# Patient Record
Sex: Female | Born: 1955 | Race: White | Hispanic: No | Marital: Married | Smoking: Former smoker
Health system: Southern US, Community
[De-identification: ages and names within clinical notes are randomized; demographics above are authoritative.]

## PROBLEM LIST (undated history)

## (undated) DIAGNOSIS — F4310 Post-traumatic stress disorder, unspecified: Secondary | ICD-10-CM

## (undated) DIAGNOSIS — F32A Depression, unspecified: Secondary | ICD-10-CM

## (undated) DIAGNOSIS — F329 Major depressive disorder, single episode, unspecified: Secondary | ICD-10-CM

## (undated) HISTORY — PX: TUBAL LIGATION: SHX77

## (undated) HISTORY — PX: TYMPANOSTOMY TUBE PLACEMENT: SHX32

---

## 2009-06-22 ENCOUNTER — Ambulatory Visit (HOSPITAL_COMMUNITY): Admission: RE | Admit: 2009-06-22 | Discharge: 2009-06-22 | Payer: Self-pay | Admitting: Family Medicine

## 2009-07-06 ENCOUNTER — Ambulatory Visit (HOSPITAL_COMMUNITY): Admission: RE | Admit: 2009-07-06 | Discharge: 2009-07-06 | Payer: Self-pay | Admitting: Family Medicine

## 2010-07-24 ENCOUNTER — Ambulatory Visit (HOSPITAL_COMMUNITY): Admission: RE | Admit: 2010-07-24 | Discharge: 2010-07-24 | Payer: Self-pay | Admitting: Family Medicine

## 2012-03-10 ENCOUNTER — Other Ambulatory Visit (HOSPITAL_COMMUNITY): Payer: Self-pay | Admitting: Family Medicine

## 2012-03-10 DIAGNOSIS — Z139 Encounter for screening, unspecified: Secondary | ICD-10-CM

## 2012-03-13 ENCOUNTER — Ambulatory Visit (HOSPITAL_COMMUNITY)
Admission: RE | Admit: 2012-03-13 | Discharge: 2012-03-13 | Disposition: A | Discharge status: Home / Self Care | Payer: Self-pay | Source: Ambulatory Visit | Attending: Family Medicine | Admitting: Family Medicine

## 2012-03-13 DIAGNOSIS — Z1231 Encounter for screening mammogram for malignant neoplasm of breast: Secondary | ICD-10-CM | POA: Insufficient documentation

## 2012-03-13 DIAGNOSIS — Z139 Encounter for screening, unspecified: Secondary | ICD-10-CM

## 2013-03-25 ENCOUNTER — Emergency Department (HOSPITAL_COMMUNITY)
Admission: EM | Admit: 2013-03-25 | Discharge: 2013-03-25 | Disposition: A | Discharge status: Home / Self Care | Payer: Self-pay | Attending: Emergency Medicine | Admitting: Emergency Medicine

## 2013-03-25 ENCOUNTER — Emergency Department (HOSPITAL_COMMUNITY): Payer: Self-pay

## 2013-03-25 ENCOUNTER — Encounter (HOSPITAL_COMMUNITY): Payer: Self-pay

## 2013-03-25 DIAGNOSIS — F329 Major depressive disorder, single episode, unspecified: Secondary | ICD-10-CM | POA: Insufficient documentation

## 2013-03-25 DIAGNOSIS — X58XXXA Exposure to other specified factors, initial encounter: Secondary | ICD-10-CM | POA: Insufficient documentation

## 2013-03-25 DIAGNOSIS — S0993XA Unspecified injury of face, initial encounter: Secondary | ICD-10-CM | POA: Insufficient documentation

## 2013-03-25 DIAGNOSIS — F17200 Nicotine dependence, unspecified, uncomplicated: Secondary | ICD-10-CM | POA: Insufficient documentation

## 2013-03-25 DIAGNOSIS — Y9389 Activity, other specified: Secondary | ICD-10-CM | POA: Insufficient documentation

## 2013-03-25 DIAGNOSIS — Y929 Unspecified place or not applicable: Secondary | ICD-10-CM | POA: Insufficient documentation

## 2013-03-25 DIAGNOSIS — M549 Dorsalgia, unspecified: Secondary | ICD-10-CM | POA: Insufficient documentation

## 2013-03-25 DIAGNOSIS — R569 Unspecified convulsions: Secondary | ICD-10-CM | POA: Insufficient documentation

## 2013-03-25 DIAGNOSIS — F3289 Other specified depressive episodes: Secondary | ICD-10-CM | POA: Insufficient documentation

## 2013-03-25 DIAGNOSIS — Z79899 Other long term (current) drug therapy: Secondary | ICD-10-CM | POA: Insufficient documentation

## 2013-03-25 HISTORY — DX: Depression, unspecified: F32.A

## 2013-03-25 HISTORY — DX: Major depressive disorder, single episode, unspecified: F32.9

## 2013-03-25 LAB — CBC WITH DIFFERENTIAL/PLATELET
Basophils Absolute: 0 10*3/uL (ref 0.0–0.1)
Eosinophils Absolute: 0 10*3/uL (ref 0.0–0.7)
Lymphocytes Relative: 12 % (ref 12–46)
MCV: 87.8 fL (ref 78.0–100.0)
Monocytes Absolute: 0.2 10*3/uL (ref 0.1–1.0)
Monocytes Relative: 3 % (ref 3–12)

## 2013-03-25 LAB — URINALYSIS, ROUTINE W REFLEX MICROSCOPIC
Bilirubin Urine: NEGATIVE
Glucose, UA: NEGATIVE mg/dL
Ketones, ur: NEGATIVE mg/dL
Leukocytes, UA: NEGATIVE
Protein, ur: NEGATIVE mg/dL
Urobilinogen, UA: 1 mg/dL (ref 0.0–1.0)
pH: >9 — ABNORMAL HIGH (ref 5.0–8.0)

## 2013-03-25 LAB — RAPID URINE DRUG SCREEN, HOSP PERFORMED
Amphetamines: NOT DETECTED
Barbiturates: NOT DETECTED
Benzodiazepines: NOT DETECTED
Cocaine: NOT DETECTED
Tetrahydrocannabinol: NOT DETECTED

## 2013-03-25 LAB — ETHANOL: Alcohol, Ethyl (B): <11 mg/dL (ref 0–11)

## 2013-03-25 LAB — BASIC METABOLIC PANEL
CO2: 30 mEq/L (ref 19–32)
Calcium: 9.1 mg/dL (ref 8.4–10.5)
Chloride: 99 mEq/L (ref 96–112)
Creatinine, Ser: 0.74 mg/dL (ref 0.50–1.10)
Sodium: 138 mEq/L (ref 135–145)

## 2013-03-25 MED ORDER — DIAZEPAM 10 MG PO TABS: 10.0000 mg | ORAL_TABLET | Freq: Two times a day (BID) | ORAL | Status: DC | PRN

## 2013-03-25 MED ORDER — HYDROMORPHONE HCL PF 1 MG/ML IJ SOLN
0.5000 mg | Freq: Once | INTRAMUSCULAR | Status: AC
  Administered 2013-03-25: 0.5 mg via INTRAVENOUS
  Filled 2013-03-25: qty 1

## 2013-03-25 MED ORDER — DIAZEPAM 5 MG PO TABS
5.0000 mg | ORAL_TABLET | Freq: Once | ORAL | Status: AC
  Administered 2013-03-25: 5 mg via ORAL
  Filled 2013-03-25: qty 1
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
## 2013-03-25 NOTE — ED Notes (Addendum)
Husband reports approx 0230 this morning he heard pt yell out.  When he walked into the bedroom he saw pt halfway off the bed convulsing and had irregular breathing.  Husband said the episode lasted approx .  Pt c/o pain to r rib area.  Pt says she did not fall but pt does not remember the episode.   Denies history of seizures.  Reports ran out of valium last week.

## 2014-06-14 ENCOUNTER — Other Ambulatory Visit (HOSPITAL_COMMUNITY): Payer: Self-pay | Admitting: Physician Assistant

## 2014-06-14 DIAGNOSIS — Z1231 Encounter for screening mammogram for malignant neoplasm of breast: Secondary | ICD-10-CM

## 2014-06-17 ENCOUNTER — Ambulatory Visit (HOSPITAL_COMMUNITY)
Admission: RE | Admit: 2014-06-17 | Discharge: 2014-06-17 | Disposition: A | Discharge status: Home / Self Care | Payer: Self-pay | Source: Ambulatory Visit | Attending: Physician Assistant | Admitting: Physician Assistant

## 2014-06-17 DIAGNOSIS — Z1231 Encounter for screening mammogram for malignant neoplasm of breast: Secondary | ICD-10-CM

## 2014-06-21 ENCOUNTER — Other Ambulatory Visit: Payer: Self-pay | Admitting: Physician Assistant

## 2014-06-21 DIAGNOSIS — R928 Other abnormal and inconclusive findings on diagnostic imaging of breast: Secondary | ICD-10-CM

## 2014-07-09 ENCOUNTER — Other Ambulatory Visit (HOSPITAL_COMMUNITY): Payer: Self-pay | Admitting: *Deleted

## 2014-07-09 DIAGNOSIS — R928 Other abnormal and inconclusive findings on diagnostic imaging of breast: Secondary | ICD-10-CM

## 2014-07-13 ENCOUNTER — Ambulatory Visit (HOSPITAL_COMMUNITY)
Admission: RE | Admit: 2014-07-13 | Discharge: 2014-07-13 | Disposition: A | Discharge status: Home / Self Care | Payer: PRIVATE HEALTH INSURANCE | Source: Ambulatory Visit | Attending: *Deleted | Admitting: *Deleted

## 2014-07-13 ENCOUNTER — Ambulatory Visit (HOSPITAL_COMMUNITY)
Admission: RE | Admit: 2014-07-13 | Discharge: 2014-07-13 | Disposition: A | Discharge status: Home / Self Care | Payer: PRIVATE HEALTH INSURANCE | Source: Ambulatory Visit | Attending: Diagnostic Radiology | Admitting: Diagnostic Radiology

## 2014-07-13 ENCOUNTER — Other Ambulatory Visit (HOSPITAL_COMMUNITY): Payer: Self-pay | Admitting: *Deleted

## 2014-07-13 DIAGNOSIS — R928 Other abnormal and inconclusive findings on diagnostic imaging of breast: Secondary | ICD-10-CM

## 2014-07-13 DIAGNOSIS — N6009 Solitary cyst of unspecified breast: Secondary | ICD-10-CM | POA: Insufficient documentation

## 2015-06-01 ENCOUNTER — Observation Stay (HOSPITAL_COMMUNITY)
Admission: EM | Admit: 2015-06-01 | Discharge: 2015-06-02 | Disposition: A | Discharge status: Home / Self Care | Payer: Self-pay | Attending: Internal Medicine | Admitting: Internal Medicine

## 2015-06-01 ENCOUNTER — Emergency Department (HOSPITAL_COMMUNITY): Payer: Self-pay

## 2015-06-01 ENCOUNTER — Encounter (HOSPITAL_COMMUNITY): Payer: Self-pay | Admitting: Emergency Medicine

## 2015-06-01 DIAGNOSIS — F419 Anxiety disorder, unspecified: Secondary | ICD-10-CM

## 2015-06-01 DIAGNOSIS — F4310 Post-traumatic stress disorder, unspecified: Secondary | ICD-10-CM

## 2015-06-01 DIAGNOSIS — Z720 Tobacco use: Secondary | ICD-10-CM

## 2015-06-01 DIAGNOSIS — R05 Cough: Secondary | ICD-10-CM | POA: Insufficient documentation

## 2015-06-01 DIAGNOSIS — F329 Major depressive disorder, single episode, unspecified: Secondary | ICD-10-CM | POA: Insufficient documentation

## 2015-06-01 DIAGNOSIS — F32A Depression, unspecified: Secondary | ICD-10-CM

## 2015-06-01 DIAGNOSIS — R0902 Hypoxemia: Principal | ICD-10-CM | POA: Diagnosis present

## 2015-06-01 DIAGNOSIS — J441 Chronic obstructive pulmonary disease with (acute) exacerbation: Secondary | ICD-10-CM

## 2015-06-01 DIAGNOSIS — Z79899 Other long term (current) drug therapy: Secondary | ICD-10-CM | POA: Insufficient documentation

## 2015-06-01 DIAGNOSIS — J9621 Acute and chronic respiratory failure with hypoxia: Secondary | ICD-10-CM | POA: Insufficient documentation

## 2015-06-01 HISTORY — DX: Post-traumatic stress disorder, unspecified: F43.10

## 2015-06-01 LAB — BASIC METABOLIC PANEL
Anion gap: 9 (ref 5–15)
BUN: 10 mg/dL (ref 6–20)
CALCIUM: 9.1 mg/dL (ref 8.9–10.3)
CO2: 27 mmol/L (ref 22–32)
Chloride: 104 mmol/L (ref 101–111)
Creatinine, Ser: 0.75 mg/dL (ref 0.44–1.00)
GFR calc Af Amer: >60 mL/min (ref >60)
GFR calc non Af Amer: >60 mL/min (ref >60)
Glucose, Bld: 102 mg/dL — ABNORMAL HIGH (ref 65–99)
POTASSIUM: 3.3 mmol/L — AB (ref 3.5–5.1)
Sodium: 140 mmol/L (ref 135–145)

## 2015-06-01 LAB — CBC WITH DIFFERENTIAL/PLATELET
Basophils Absolute: 0 10*3/uL (ref 0.0–0.1)
Basophils Relative: 1 % (ref 0–1)
EOS ABS: 0.1 10*3/uL (ref 0.0–0.7)
EOS PCT: 1 % (ref 0–5)
HEMATOCRIT: 41.8 % (ref 36.0–46.0)
Hemoglobin: 14.4 g/dL (ref 12.0–15.0)
LYMPHS ABS: 2.3 10*3/uL (ref 0.7–4.0)
LYMPHS PCT: 44 % (ref 12–46)
MCH: 31.4 pg (ref 26.0–34.0)
MCHC: 34.4 g/dL (ref 30.0–36.0)
MCV: 91.3 fL (ref 78.0–100.0)
MONOS PCT: 7 % (ref 3–12)
Monocytes Absolute: 0.3 10*3/uL (ref 0.1–1.0)
Neutro Abs: 2.5 10*3/uL (ref 1.7–7.7)
Neutrophils Relative %: 47 % (ref 43–77)
PLATELETS: 160 10*3/uL (ref 150–400)
RBC: 4.58 MIL/uL (ref 3.87–5.11)
RDW: 12.5 % (ref 11.5–15.5)
WBC: 5.3 10*3/uL (ref 4.0–10.5)

## 2015-06-01 LAB — D-DIMER, QUANTITATIVE: D-Dimer, Quant: 0.28 ug/mL-FEU (ref 0.00–0.48)

## 2015-06-01 MED ORDER — METHYLPREDNISOLONE SODIUM SUCC 125 MG IJ SOLR
125.0000 mg | Freq: Once | INTRAMUSCULAR | Status: AC
  Administered 2015-06-01: 125 mg via INTRAVENOUS
  Filled 2015-06-01: qty 2

## 2015-06-01 MED ORDER — METHYLPREDNISOLONE SODIUM SUCC 125 MG IJ SOLR
60.0000 mg | Freq: Four times a day (QID) | INTRAMUSCULAR | Status: DC
Start: 2015-06-01 — End: 2015-06-02
  Administered 2015-06-01 – 2015-06-02 (×3): 60 mg via INTRAVENOUS
  Filled 2015-06-01 (×3): qty 2

## 2015-06-01 MED ORDER — IPRATROPIUM-ALBUTEROL 0.5-2.5 (3) MG/3ML IN SOLN
3.0000 mL | Freq: Four times a day (QID) | RESPIRATORY_TRACT | Status: DC
  Administered 2015-06-02 (×2): 3 mL via RESPIRATORY_TRACT
  Filled 2015-06-01 (×2): qty 3

## 2015-06-01 MED ORDER — IPRATROPIUM BROMIDE 0.06 % NA SOLN
2.0000 | Freq: Four times a day (QID) | NASAL | Status: DC | PRN
Start: 2015-06-01 — End: 2015-06-02
  Filled 2015-06-01: qty 15

## 2015-06-01 MED ORDER — CITALOPRAM HYDROBROMIDE 20 MG PO TABS
40.0000 mg | ORAL_TABLET | Freq: Every evening | ORAL | Status: DC
  Administered 2015-06-02: 40 mg via ORAL
  Filled 2015-06-01 (×2): qty 1

## 2015-06-01 MED ORDER — ALPRAZOLAM 0.5 MG PO TABS
1.0000 mg | ORAL_TABLET | Freq: Two times a day (BID) | ORAL | Status: DC
  Administered 2015-06-01: 2 mg via ORAL
  Administered 2015-06-02: 1 mg via ORAL
  Filled 2015-06-01 (×2): qty 4

## 2015-06-01 MED ORDER — GUAIFENESIN ER 600 MG PO TB12
1200.0000 mg | ORAL_TABLET | Freq: Two times a day (BID) | ORAL | Status: DC
  Administered 2015-06-01 – 2015-06-02 (×2): 1200 mg via ORAL
  Filled 2015-06-01 (×2): qty 2

## 2015-06-01 MED ORDER — IPRATROPIUM BROMIDE 0.02 % IN SOLN: 0.5000 mg | Freq: Once | RESPIRATORY_TRACT | Status: DC

## 2015-06-01 MED ORDER — ALBUTEROL SULFATE (2.5 MG/3ML) 0.083% IN NEBU
5.0000 mg | INHALATION_SOLUTION | Freq: Once | RESPIRATORY_TRACT | Status: AC
  Administered 2015-06-01: 5 mg via RESPIRATORY_TRACT
  Filled 2015-06-01: qty 6

## 2015-06-01 MED ORDER — IPRATROPIUM-ALBUTEROL 0.5-2.5 (3) MG/3ML IN SOLN
3.0000 mL | RESPIRATORY_TRACT | Status: DC
  Administered 2015-06-01: 3 mL via RESPIRATORY_TRACT
  Filled 2015-06-01: qty 3

## 2015-06-01 MED ORDER — ALBUTEROL SULFATE (2.5 MG/3ML) 0.083% IN NEBU
2.5000 mg | INHALATION_SOLUTION | Freq: Once | RESPIRATORY_TRACT | Status: AC
  Administered 2015-06-01: 2.5 mg via RESPIRATORY_TRACT
  Filled 2015-06-01: qty 3

## 2015-06-01 MED ORDER — HEPARIN SODIUM (PORCINE) 5000 UNIT/ML IJ SOLN
5000.0000 [IU] | Freq: Three times a day (TID) | INTRAMUSCULAR | Status: DC
  Filled 2015-06-01: qty 1

## 2015-06-01 MED ORDER — IPRATROPIUM-ALBUTEROL 0.5-2.5 (3) MG/3ML IN SOLN
3.0000 mL | Freq: Once | RESPIRATORY_TRACT | Status: AC
  Administered 2015-06-01: 3 mL via RESPIRATORY_TRACT
  Filled 2015-06-01: qty 3

## 2015-06-01 MED ORDER — CITALOPRAM HYDROBROMIDE 20 MG PO TABS
ORAL_TABLET | ORAL | Status: AC
Start: 2015-06-01 — End: 2015-06-01
  Filled 2015-06-01: qty 2

## 2015-06-01 MED ORDER — ALBUTEROL SULFATE (2.5 MG/3ML) 0.083% IN NEBU
2.5000 mg | INHALATION_SOLUTION | RESPIRATORY_TRACT | Status: AC | PRN
  Administered 2015-06-01: 2.5 mg via RESPIRATORY_TRACT
  Filled 2015-06-01: qty 3

## 2015-06-01 MED ORDER — ONDANSETRON HCL 4 MG PO TABS: 4.0000 mg | ORAL_TABLET | Freq: Four times a day (QID) | ORAL | Status: DC | PRN

## 2015-06-01 MED ORDER — NICOTINE 7 MG/24HR TD PT24
7.0000 mg | MEDICATED_PATCH | Freq: Every day | TRANSDERMAL | Status: DC
  Administered 2015-06-01 – 2015-06-02 (×2): 7 mg via TRANSDERMAL
  Filled 2015-06-01 (×3): qty 1

## 2015-06-01 MED ORDER — ALBUTEROL SULFATE (2.5 MG/3ML) 0.083% IN NEBU: 5.0000 mg | INHALATION_SOLUTION | Freq: Once | RESPIRATORY_TRACT | Status: DC

## 2015-06-01 MED ORDER — NICOTINE 7 MG/24HR TD PT24
MEDICATED_PATCH | TRANSDERMAL | Status: AC
  Filled 2015-06-01: qty 1

## 2015-06-01 MED ORDER — ACETAMINOPHEN 325 MG PO TABS: 650.0000 mg | ORAL_TABLET | Freq: Four times a day (QID) | ORAL | Status: DC | PRN

## 2015-06-01 MED ORDER — DOXYCYCLINE HYCLATE 100 MG PO TABS
100.0000 mg | ORAL_TABLET | Freq: Two times a day (BID) | ORAL | Status: DC
  Administered 2015-06-01 – 2015-06-02 (×2): 100 mg via ORAL
  Filled 2015-06-01 (×2): qty 1

## 2015-06-01 MED ORDER — ACETAMINOPHEN 650 MG RE SUPP: 650.0000 mg | Freq: Four times a day (QID) | RECTAL | Status: DC | PRN

## 2015-06-01 MED ORDER — ONDANSETRON HCL 4 MG/2ML IJ SOLN: 4.0000 mg | Freq: Four times a day (QID) | INTRAMUSCULAR | Status: DC | PRN

## 2015-06-01 MED ORDER — OXYCODONE HCL 5 MG PO TABS
5.0000 mg | ORAL_TABLET | ORAL | Status: DC | PRN
  Filled 2015-06-01: qty 1

## 2015-06-01 NOTE — ED Notes (Signed)
Pt reports increasing sob since Thursday with no relief from 2 nebulizer tx's today. Pt is not on home O2.

## 2015-06-01 NOTE — H&P (Signed)
[facility] History and Physical  Diane KiefVicki W Franey WUJ:[id number] DOB: Dec 07, 1955 DOA: 06/01/2015  Referring physician: Dr Rubin PayorPickering - [facility] PCP: Pershing ProudJACKSON,SAMANTHA, PA-C   Chief Complaint: SOB  HPI: Diane Simmons is a 59 y.o. female  7 days of SOB. She intermittent but now constant, getting worse, associated with wheezing, minimal productive cough, "rattle in chest, ". Worse with exertion, improves with rest and staying cool, patient states that she had to panic attacks due to the stress of not feeling like she can breathe. Patient continues to smoke but stopped since onset of symptoms as this makes it worse. Patient has tried Mucinex with little to no benefit, patient went to a free walking clinic today and was told that her oxygen levels were 83% and told to come to the ED. Significant URI symptoms especially postnasal drip which started a couple days before shortness of breath.   Review of Systems:  Constitutional:  No weight loss, night sweats, Fevers, chills, HEENT:  No headaches, Difficulty swallowing,Tooth/dental problems,Sore throat, ,  Cardio-vascular:  No chest pain, Orthopnea, PND, swelling in lower extremities, anasarca, dizziness, palpitations  GI:  No heartburn, indigestion, abdominal pain, nausea, vomiting, diarrhea, change in bowel habits, loss of appetite  Resp: Per HPI Skin:  no rash or lesions.  GU:  no dysuria, change in color of urine, no urgency or frequency. No flank pain.  Musculoskeletal:   No joint pain or swelling. No decreased range of motion. No back pain.  Psych:  No change in mood or affect. No depression or anxiety. No memory loss.   Past Medical History  Diagnosis Date  . Depression   . PTSD (post-traumatic stress disorder)    Past Surgical History  Procedure Laterality Date  . Tubal ligation    . Cesarean section     Social History:  reports that she has been smoking Cigarettes.  She has been smoking about 0.25 packs per day. She does not  have any smokeless tobacco history on file. She reports that she does not drink alcohol or use illicit drugs.  Allergies  Allergen Reactions  . Aleve [Naproxen Sodium] Anaphylaxis  . Erythromycin Other (See Comments)    Makes patient feel like she is having morning sickness    Family History  Problem Relation Age of Onset  . Prostate cancer Father   . Diabetes Maternal Grandfather      Prior to Admission medications   Medication Sig Start Date End Date Taking? Authorizing Provider  alprazolam Prudy Feeler(XANAX) 2 MG tablet Take 1 mg by mouth 2 (two) times daily as needed for anxiety.   Yes Historical Provider, MD  citalopram (CELEXA) 40 MG tablet Take 40 mg by mouth every evening.   Yes Historical Provider, MD  diazepam (VALIUM) 10 MG tablet Take 1 tablet (10 mg total) by mouth 2 (two) times daily as needed for anxiety. Patient not taking: Reported on 06/01/2015 03/25/13   Donnetta HutchingBrian Cook, MD   Physical Exam: Filed Vitals:   06/01/15 1348 06/01/15 1617 06/01/15 1627 06/01/15 1739  BP:   106/65 109/67  Pulse:   79 87  Temp:      TempSrc:      Resp:   18 22  SpO2: 94% 95% 93% 94%    Wt Readings from Last 3 Encounters:  No data found for Wt    General: Mild distress Eyes:  PERRL, normal lids, irise, conjunctival injection ENT: Mucous membranes Neck:  no LAD, masses or thyromegaly Cardiovascular:  RRR, no m/r/g. No  LE edema. Respiratory: Diffuse wheezing in all lung fields, few crackles in bases, increased respiratory effort, Abdomen:  soft, ntnd Skin:  no rash or induration seen on limited exam Musculoskeletal:  grossly normal tone BUE/BLE Psychiatric:  grossly normal mood and affect, speech fluent and appropriate Neurologic:  grossly non-focal.          Labs on Admission:  Basic Metabolic Panel:  Recent Labs Lab 06/01/15 1410  NA 140  K 3.3*  CL 104  CO2 27  GLUCOSE 102*  BUN 10  CREATININE 0.75  CALCIUM 9.1   Liver Function Tests: No results for input(s): AST, ALT,  ALKPHOS, BILITOT, PROT, ALBUMIN in the last 168 hours. No results for input(s): LIPASE, AMYLASE in the last 168 hours. No results for input(s): AMMONIA in the last 168 hours. CBC:  Recent Labs Lab 06/01/15 1410  WBC 5.3  NEUTROABS 2.5  HGB 14.4  HCT 41.8  MCV 91.3  PLT 160   Cardiac Enzymes: No results for input(s): CKTOTAL, CKMB, CKMBINDEX, TROPONINI in the last 168 hours.  BNP (last 3 results) No results for input(s): BNP in the last 8760 hours.  ProBNP (last 3 results) No results for input(s): PROBNP in the last 8760 hours.  CBG: No results for input(s): GLUCAP in the last 168 hours.  Radiological Exams on Admission: Dg Chest 2 View  06/01/2015   CLINICAL DATA:  Nonproductive cough and chest tightness for 6 days. Difficulty breathing.  EXAM: CHEST  2 VIEW  COMPARISON:  March 25, 2013 and June 22, 2009  FINDINGS: There is mild scarring in the left base. No edema or consolidation is apparent. Heart size and pulmonary vascularity are normal. No apparent adenopathy. There is mid thoracic dextroscoliosis. There is marked collapse of the T5 vertebral body.  IMPRESSION: No edema or consolidation. Mild scarring left base. Marked collapse of the T5 vertebral body, present on 2014 study.   Electronically Signed   By: Bretta Bang III M.D.   On: 06/01/2015 13:30    Assessment/Plan Principal Problem:   COPD exacerbation Active Problems:   Hypoxia   Depression   PTSD (post-traumatic stress disorder)   Tobacco abuse   Anxiety   COPD exacerbation: No formal diagnoses of COPD the patient with new O2 requirement, diffuse wheezing, productive cough, and chronic smoker. CXR without evidence of pneumonia - MedSurg -  Solu-Medrol 60 mg every 6 - Duo nebs every 4 hours - Doxycycline - Formal PFTs after discharge - Nasal Atrovent, Mucinex - O2 PRN  Depression/PTSD: - Continue Celexa  Anxiety: -  continue Xanax   Tobacco: Smokes quarter pack per day - Nicotine patch -  Tobacco cessation counseling  Code Status: FULL DVT Prophylaxis: Hep Family Communication: Husband Disposition Plan: Pendingi mprovement     Sony Schlarb J, MD Family [facility] [email or web address] Password [email or web address]

## 2015-06-01 NOTE — ED Notes (Signed)
Pt placed in wheelchair on O2 at 2l, O2 saturation increased to 94%.

## 2015-06-01 NOTE — ED Notes (Signed)
MD at bedside. 

## 2015-06-01 NOTE — ED Provider Notes (Signed)
CSN: [id number]     Arrival date & time 06/01/15  1218 History   First MD Initiated Contact with Patient 06/01/15 1254     Chief Complaint  Patient presents with  . Shortness of Breath     (Consider location/radiation/quality/duration/timing/severity/associated sxs/prior Treatment) Patient is a 58 y.o. female presenting with shortness of breath. The history is provided by the patient.  Shortness of Breath Severity:  Moderate Associated symptoms: cough   Associated symptoms: no abdominal pain, no chest pain, no fever, no headaches and no vomiting    patient has had shortness of breath and cough the last several days. States she feels as if there is some mixed, but nothing has come up. States it feels aggressive in her throat. She is apparently a smoker but she downplays this on the history. 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 States she has not smoked much once was started. Denies fever. She states her husband also had similar symptoms. No swelling or legs. Was seen at the free clinic and found to have saturations in the 80s. They stay down there after breathing treatments. Denies swelling or legs. She is not on oxygen at home and her saturations up, up to the 90s on nasal cannula  Past Medical History  Diagnosis Date  . Depression   . PTSD (post-traumatic stress disorder)    Past Surgical History  Procedure Laterality Date  . Tubal ligation    . Cesarean section     Family History  Problem Relation Age of Onset  . Prostate cancer Father   . Diabetes Maternal Grandfather    History  Substance Use Topics  . Smoking status: Current Every Day Smoker -- 0.25 packs/day    Types: Cigarettes  . Smokeless tobacco: Not on file  . Alcohol Use: No   OB History    No data available     Review of Systems  Constitutional: Positive for fatigue. Negative for fever, activity change and appetite change.  Eyes: Negative for pain.  Respiratory: Positive for cough and  shortness of breath. Negative for chest tightness.   Cardiovascular: Negative for chest pain and leg swelling.  Gastrointestinal: Negative for nausea, vomiting, abdominal pain and diarrhea.  Genitourinary: Negative for flank pain.  Musculoskeletal: Negative for back pain.  Neurological: Negative for weakness, numbness and headaches.  Psychiatric/Behavioral: Negative for behavioral problems.      Allergies  Aleve and Erythromycin  Home Medications   Prior to Admission medications   Medication Sig Start Date End Date Taking? Authorizing Provider  alprazolam Prudy Feeler) 2 MG tablet Take 1-2 mg by mouth 2 (two) times daily. 1 mg in the morning and 2 mg at bedtime.   Yes Historical Provider, MD  citalopram (CELEXA) 40 MG tablet Take 40 mg by mouth every evening.   Yes Historical Provider, MD  diazepam (VALIUM) 10 MG tablet Take 1 tablet (10 mg total) by mouth 2 (two) times daily as needed for anxiety. Patient not taking: Reported on 06/01/2015 03/25/13   Donnetta Hutching, MD   BP 112/70 mmHg  Pulse 85  Temp(Src) 97.8 F (36.6 C) (Oral)  Resp 18  Ht  (1.626 m)  Wt 130 lb (58.968 kg)  BMI 22.30 kg/m2  SpO2 95% Physical Exam  Constitutional: She appears well-developed and well-nourished.  HENT:  Head: Atraumatic.  Eyes: Pupils are equal, round, and reactive to light.  Neck: Neck supple.  Cardiovascular: Normal rate.   Pulmonary/Chest:  Diffuse harsh wheezes and prolonged  expirations  Abdominal: Soft.  Neurological: She is alert.    ED Course  Procedures (including critical care time) Labs Review Labs Reviewed  BASIC METABOLIC PANEL - Abnormal; Notable for the following:    Potassium 3.3 (*)    Glucose, Bld 102 (*)    All other components within normal limits  BASIC METABOLIC PANEL - Abnormal; Notable for the following:    Glucose, Bld 148 (*)    All other components within normal limits  CBC WITH DIFFERENTIAL/PLATELET  D-DIMER, QUANTITATIVE (NOT AT [facility])  CBC  HEMOGLOBIN  A1C    Imaging Review Dg Chest 2 View  06/01/2015   CLINICAL DATA:  Nonproductive cough and chest tightness for 6 days. Difficulty breathing.  EXAM: CHEST  2 VIEW  COMPARISON:  March 25, 2013 and June 22, 2009  FINDINGS: There is mild scarring in the left base. No edema or consolidation is apparent. Heart size and pulmonary vascularity are normal. No apparent adenopathy. There is mid thoracic dextroscoliosis. There is marked collapse of the T5 vertebral body.  IMPRESSION: No edema or consolidation. Mild scarring left base. Marked collapse of the T5 vertebral body, present on 2014 study.   Electronically Signed   By: Bretta BangWilliam  Woodruff III M.D.   On: 06/01/2015 13:30     EKG Interpretation None      MDM   Final diagnoses:  Hypoxia    Patient with shortness of breath and cough. Likely URI on top of COPD. She does downplay her cigarette use however. Will be hypoxic on room air. Will admit to internal medicine. D-dimer negative.    Benjiman CoreNathan Logen Heintzelman, MD 06/02/15 [phone]

## 2015-06-01 NOTE — ED Notes (Signed)
Removed o2  and allowed pt to sit on side of bed.  Pt's Sats dropped to 88% on room air.

## 2015-06-02 DIAGNOSIS — J441 Chronic obstructive pulmonary disease with (acute) exacerbation: Secondary | ICD-10-CM

## 2015-06-02 DIAGNOSIS — J9621 Acute and chronic respiratory failure with hypoxia: Secondary | ICD-10-CM | POA: Insufficient documentation

## 2015-06-02 DIAGNOSIS — F4310 Post-traumatic stress disorder, unspecified: Secondary | ICD-10-CM

## 2015-06-02 DIAGNOSIS — F419 Anxiety disorder, unspecified: Secondary | ICD-10-CM

## 2015-06-02 DIAGNOSIS — Z720 Tobacco use: Secondary | ICD-10-CM

## 2015-06-02 LAB — BASIC METABOLIC PANEL
Anion gap: 9 (ref 5–15)
BUN: 15 mg/dL (ref 6–20)
CALCIUM: 9 mg/dL (ref 8.9–10.3)
CO2: 29 mmol/L (ref 22–32)
CREATININE: 0.6 mg/dL (ref 0.44–1.00)
Chloride: 101 mmol/L (ref 101–111)
GFR calc Af Amer: >60 mL/min (ref >60)
GFR calc non Af Amer: >60 mL/min (ref >60)
Glucose, Bld: 148 mg/dL — ABNORMAL HIGH (ref 65–99)
POTASSIUM: 4 mmol/L (ref 3.5–5.1)
SODIUM: 139 mmol/L (ref 135–145)

## 2015-06-02 LAB — CBC
HCT: 40.8 % (ref 36.0–46.0)
Hemoglobin: 13.8 g/dL (ref 12.0–15.0)
MCH: 30.8 pg (ref 26.0–34.0)
MCHC: 33.8 g/dL (ref 30.0–36.0)
MCV: 91.1 fL (ref 78.0–100.0)
Platelets: 182 10*3/uL (ref 150–400)
RBC: 4.48 MIL/uL (ref 3.87–5.11)
RDW: 12.5 % (ref 11.5–15.5)
WBC: 7.2 10*3/uL (ref 4.0–10.5)

## 2015-06-02 MED ORDER — PREDNISONE 10 MG PO TABS: ORAL_TABLET | ORAL | Status: DC

## 2015-06-02 MED ORDER — GUAIFENESIN ER 600 MG PO TB12
1200.0000 mg | ORAL_TABLET | Freq: Two times a day (BID) | ORAL | Status: DC
Start: 2015-06-02 — End: 2015-11-17

## 2015-06-02 MED ORDER — ALBUTEROL SULFATE (2.5 MG/3ML) 0.083% IN NEBU: 2.5000 mg | INHALATION_SOLUTION | Freq: Four times a day (QID) | RESPIRATORY_TRACT | Status: AC | PRN

## 2015-06-02 MED ORDER — CEPHALEXIN 500 MG PO CAPS: 500.0000 mg | ORAL_CAPSULE | Freq: Three times a day (TID) | ORAL | Status: DC

## 2015-06-02 MED ORDER — NICOTINE 7 MG/24HR TD PT24: 7.0000 mg | MEDICATED_PATCH | Freq: Every day | TRANSDERMAL | Status: DC

## 2015-06-02 NOTE — Care Management Note (Signed)
Case Management Note  Patient Details  Name: Diane KiefVicki W Simmons MRN: [id number] Date of Birth: 1956/08/17  Subjective/Objective:                  Pt admitted from home with COPD exacerbation. Pt lives with her husband and will return home at discharge. Pt is independent with ADL's. Pt has no insurance but [facility]  Action/Plan: [facility] since [facility] has [facility]. Neb machine will be delivered to pts home after discharge. Pt stated she could pay for her medications. Pt does not qualify for home O2. Pt and pts nurse aware of discharge arrangements.  Expected Discharge Date:                  Expected Discharge Plan:  Home/Self Care  In-House Referral:  Financial Counselor  Discharge planning Services  CM Consult  Post Acute Care Choice:  Durable Medical Equipment Choice offered to:  Patient  DME Arranged:  Nebulizer machine DME Agency:  [facility]  [facility]     Status of Service:  Completed, signed off  Medicare Important Message Given:    Date Medicare IM Given:    Medicare IM give by:    Date Additional Medicare IM Given:    Additional Medicare Important Message give by:     If discussed at Long Length of Stay Meetings, dates discussed:    Additional Comments:  Cheryl FlashBlackwell, Lashon Hillier Crowder, RN 06/02/2015, 12:01 PM

## 2015-06-02 NOTE — Progress Notes (Signed)
SATURATION QUALIFICATIONS: (This note is used to comply with regulatory documentation for home oxygen)  Patient Saturations on Room Air at Rest = 95% RA  Patient Saturations on Room Air while Ambulating = 93-95 % RA  Patient Saturations on Liters of oxygen while Ambulating = N/A  Please briefly explain why patient needs home oxygen:  Pt does not qualify for home oxygen at discharge.

## 2015-06-02 NOTE — Discharge Summary (Signed)
Physician Discharge Summary  Diane Simmons ZOX:[id number] DOB: Oct 23, 1956 DOA: 06/01/2015  PCP: Diane ProudJACKSON,SAMANTHA, PA-C  Admit date: 06/01/2015 Discharge date: 06/02/2015  Time spent: 40 minutes  Recommendations for Outpatient Follow-up:  1. Patient will benefit from outpatient pulmonary function tests 2. Follow-up with primary care physician 1-2 weeks  Discharge Diagnoses:  Principal Problem:   COPD exacerbation Active Problems:   Hypoxia   Depression   PTSD (post-traumatic stress disorder)   Tobacco abuse   Anxiety   Acute respiratory failure with hypoxia   Discharge Condition: Improved  Diet recommendation: Regular diet  Filed Weights   06/01/15 1852  Weight: 58.968 kg (130 lb)    History of present illness:  This patient was admitted to the hospital with progressive shortness of breath. She had associated wheezing, productive cough. She was noted to be hypoxic at her primary care physician's office and was sent to the ER for evaluation. She was felt to have COPD exacerbation and was admitted for further treatments  Hospital Course:  Patient was started on intravenous steroids, bronchodilators and antibiotic. She quickly improved and is now back to baseline. She was ambulated on room air and did not have any shortness of breath. She maintain saturations greater than 90%. She was placed on a prednisone taper, continued on antibiotic as well as bronchodilators. She reports that she does not have a formal diagnosis COPD . I have recommended outpatient pulmonary function test once her acute exacerbation has resolved. She was advised to stop smoking.  Procedures:    Consultations:    Discharge Exam: Filed Vitals:   06/02/15 0733  BP:   Pulse: 85  Temp:   Resp:     General: No acute distress Cardiovascular: S1, S2, regular rate and rhythm Respiratory: Clear to auscultation bilaterally, no wheezes  Discharge Instructions   Discharge Instructions    DME  Nebulizer machine    Complete by:  As directed      Diet - low sodium heart healthy    Complete by:  As directed      Increase activity slowly    Complete by:  As directed           Discharge Medication List as of 06/02/2015 12:10 PM    START taking these medications   Details  albuterol (PROVENTIL) (2.5 MG/3ML) 0.083% nebulizer solution Take 3 mLs (2.5 mg total) by nebulization every 6 (six) hours as needed for wheezing or shortness of breath., Starting 06/02/2015, Until Discontinued, Print    cephALEXin (KEFLEX) 500 MG capsule Take 1 capsule (500 mg total) by mouth 3 (three) times daily., Starting 06/02/2015, Until Discontinued, Print    guaiFENesin (MUCINEX) 600 MG 12 hr tablet Take 2 tablets (1,200 mg total) by mouth 2 (two) times daily., Starting 06/02/2015, Until Discontinued, Print    nicotine (NICODERM CQ - DOSED IN MG/24 HR) 7 mg/24hr patch Place 1 patch (7 mg total) onto the skin daily., Starting 06/02/2015, Until Discontinued, Print    predniSONE (DELTASONE) 10 MG tablet Take 40mg  po daily for 2 days then 30mg  po daily for 2 days then 20mg  po daily for 2 days then 10mg  po daily for 2 days then stop, Print      CONTINUE these medications which have NOT CHANGED   Details  alprazolam (XANAX) 2 MG tablet Take 1-2 mg by mouth 2 (two) times daily. 1 mg in the morning and 2 mg at bedtime., Until Discontinued, Historical Med    citalopram (CELEXA) 40 MG tablet Take 40  mg by mouth every evening., Until Discontinued, Historical Med      STOP taking these medications     diazepam (VALIUM) 10 MG tablet        Allergies  Allergen Reactions  . Aleve [Naproxen Sodium] Anaphylaxis  . Erythromycin Other (See Comments)    Makes patient feel like she is having morning sickness   Follow-up Information    Follow up with JACKSON,SAMANTHA, PA-C.   Specialty:  Family Medicine   Why:  Office Will Call You With Appointment   Contact information:   [address] [place]  [address] [phone]        The results of significant diagnostics from this hospitalization (including imaging, microbiology, ancillary and laboratory) are listed below for reference.    Significant Diagnostic Studies: Dg Chest 2 View  06/01/2015   CLINICAL DATA:  Nonproductive cough and chest tightness for 6 days. Difficulty breathing.  EXAM: CHEST  2 VIEW  COMPARISON:  March 25, 2013 and June 22, 2009  FINDINGS: There is mild scarring in the left base. No edema or consolidation is apparent. Heart size and pulmonary vascularity are normal. No apparent adenopathy. There is mid thoracic dextroscoliosis. There is marked collapse of the T5 vertebral body.  IMPRESSION: No edema or consolidation. Mild scarring left base. Marked collapse of the T5 vertebral body, present on 2014 study.   Electronically Signed   By: Bretta Bang III M.D.   On: 06/01/2015 13:30    Microbiology: No results found for this or any previous visit (from the past 240 hour(s)).   Labs: Basic Metabolic Panel:  Recent Labs Lab 06/01/15 1410 06/02/15 0538  NA 140 139  K 3.3* 4.0  CL 104 101  CO2 27 29  GLUCOSE 102* 148*  BUN 10 15  CREATININE 0.75 0.60  CALCIUM 9.1 9.0   Liver Function Tests: No results for input(s): AST, ALT, ALKPHOS, BILITOT, PROT, ALBUMIN in the last 168 hours. No results for input(s): LIPASE, AMYLASE in the last 168 hours. No results for input(s): AMMONIA in the last 168 hours. CBC:  Recent Labs Lab 06/01/15 1410 06/02/15 0538  WBC 5.3 7.2  NEUTROABS 2.5  --   HGB 14.4 13.8  HCT 41.8 40.8  MCV 91.3 91.1  PLT 160 182   Cardiac Enzymes: No results for input(s): CKTOTAL, CKMB, CKMBINDEX, TROPONINI in the last 168 hours. BNP: BNP (last 3 results) No results for input(s): BNP in the last 8760 hours.  ProBNP (last 3 results) No results for input(s): PROBNP in the last 8760 hours.  CBG: No results for input(s): GLUCAP in the last 168  hours.     Signed:  MEMON,JEHANZEB  [facility] 06/02/2015, 7:38 PM

## 2015-06-02 NOTE — Progress Notes (Signed)
Pt discharged home today per Dr. Memon. Pt's IV site D/C'd and WDL. Pt's VSS. Pt provided with home medication list, discharge instructions and prescriptions. Verbalized understanding. Pt left floor via WC in stable condition accompanied by RN. 

## 2015-06-02 NOTE — Progress Notes (Signed)
Nutrition Brief Note  Patient identified on the Malnutrition Screening Tool (MST) Report  Wt Readings from Last 15 Encounters:  06/01/15 130 lb (58.968 kg)    Body mass index is 22.3 kg/(m^2). Patient meets criteria for normal range based on current BMI. She has been maintaining weight around 125#. Pt says she had h/o weight loss when her son was living and she was taking care of him.  She lives with her husband and endorses consistent intake daily up until recently when she began to be short of breath. Pt takes MVI and B-complex daily for past several months.  Current diet order is regular and patient ate 100% of breakfast this morning.  Labs and medications reviewed. She is hoping to be released today.   No nutrition interventions warranted at this time. If nutrition issues arise, please consult RD.   Royann ShiversLynn Kemond Amorin MS,RD,CSG,LDN Office: [phone] Pager: [phone]

## 2015-06-03 LAB — HEMOGLOBIN A1C
Hgb A1c MFr Bld: 5.7 % — ABNORMAL HIGH (ref 4.8–5.6)
Mean Plasma Glucose: 117 mg/dL

## 2015-07-12 ENCOUNTER — Other Ambulatory Visit (HOSPITAL_COMMUNITY): Payer: Self-pay | Admitting: *Deleted

## 2015-07-12 DIAGNOSIS — Z1231 Encounter for screening mammogram for malignant neoplasm of breast: Secondary | ICD-10-CM

## 2015-07-18 ENCOUNTER — Ambulatory Visit (HOSPITAL_COMMUNITY)
Admission: RE | Admit: 2015-07-18 | Discharge: 2015-07-18 | Disposition: A | Discharge status: Home / Self Care | Payer: PRIVATE HEALTH INSURANCE | Source: Ambulatory Visit | Attending: *Deleted | Admitting: *Deleted

## 2015-07-18 DIAGNOSIS — Z1231 Encounter for screening mammogram for malignant neoplasm of breast: Secondary | ICD-10-CM | POA: Diagnosis present

## 2015-11-17 ENCOUNTER — Ambulatory Visit: Payer: Self-pay | Admitting: Physician Assistant

## 2015-11-17 ENCOUNTER — Encounter: Payer: Self-pay | Admitting: Physician Assistant

## 2015-11-17 VITALS — BP 112/70 | HR 82 | Temp 97.9°F | Ht 62.0 in | Wt 138.6 lb

## 2015-11-17 DIAGNOSIS — F419 Anxiety disorder, unspecified: Secondary | ICD-10-CM

## 2015-11-17 DIAGNOSIS — Z1211 Encounter for screening for malignant neoplasm of colon: Secondary | ICD-10-CM

## 2015-11-17 DIAGNOSIS — H6123 Impacted cerumen, bilateral: Secondary | ICD-10-CM

## 2015-11-17 MED ORDER — CITALOPRAM HYDROBROMIDE 40 MG PO TABS: 40.0000 mg | ORAL_TABLET | Freq: Every day | ORAL | Status: AC

## 2015-11-17 NOTE — Progress Notes (Signed)
BP 112/70 mmHg  Pulse 82  Temp(Src) 97.9 F (36.6 C)  Ht 5\' 2"  (1.575 m)  Wt 138 lb 9.6 oz (62.869 kg)  BMI 25.34 kg/m2  SpO2 97%   Subjective:    Patient ID: Diane KiefVicki W Tonkovich, female    DOB: 11-11-1956, 59 y.o.   MRN: [id number]  HPI: Diane Simmons is a 59 y.o. female presenting on 11/17/2015 for Follow-up   HPI   Pt says she is going to disability again in Februay.  Asked pt why she is trying/what is her reason for needing disability.  She states "just everything".  She says mostly grief and ptsd.  Pt states she is no longer going to counseling because she feels like she doesn't need it.  Pt has stopped smoking since her last OV.  Relevant past medical, surgical, family and social history reviewed and updated as indicated. Interim medical history since our last visit reviewed. Allergies and medications reviewed and updated.  Current outpatient prescriptions:  .  albuterol (PROVENTIL) (2.5 MG/3ML) 0.083% nebulizer solution, Take 3 mLs (2.5 mg total) by nebulization every 6 (six) hours as needed for wheezing or shortness of breath., Disp: 75 mL, Rfl: 12 .  alprazolam (XANAX) 2 MG tablet, Take by mouth 2 (two) times daily as needed for anxiety. 1 mg in the morning and 2 mg at bedtime., Disp: , Rfl:  .  citalopram (CELEXA) 40 MG tablet, Take 1 tablet (40 mg total) by mouth daily., Disp: 30 tablet, Rfl: 6  Review of Systems  Constitutional: Positive for fatigue. Negative for fever, chills, diaphoresis, appetite change and unexpected weight change.  HENT: Positive for congestion, ear pain and hearing loss. Negative for dental problem, drooling, facial swelling, mouth sores, sneezing, sore throat, trouble swallowing and voice change.   Eyes: Negative for pain, discharge, redness, itching and visual disturbance.  Respiratory: Positive for shortness of breath. Negative for cough, choking and wheezing.   Cardiovascular: Negative for chest pain, palpitations and leg swelling.   Gastrointestinal: Negative for vomiting, abdominal pain, diarrhea, constipation and blood in stool.  Endocrine: Negative for cold intolerance, heat intolerance and polydipsia.  Genitourinary: Negative for dysuria, hematuria and decreased urine volume.  Musculoskeletal: Negative for back pain, arthralgias and gait problem.  Skin: Negative for rash.  Allergic/Immunologic: Negative for environmental allergies.  Neurological: Negative for seizures, syncope, light-headedness and headaches.  Hematological: Negative for adenopathy.  Psychiatric/Behavioral: Positive for dysphoric mood. Negative for suicidal ideas and agitation. The patient is nervous/anxious.     Per HPI unless specifically indicated above     Objective:    BP 112/70 mmHg  Pulse 82  Temp(Src) 97.9 F (36.6 C)  Ht 5\' 2"  (1.575 m)  Wt 138 lb 9.6 oz (62.869 kg)  BMI 25.34 kg/m2  SpO2 97%  Wt Readings from Last 3 Encounters:  11/17/15 138 lb 9.6 oz (62.869 kg)  06/01/15 130 lb (58.968 kg)    Physical Exam  Constitutional: She is oriented to person, place, and time. She appears well-developed and well-nourished.  HENT:  Head: Normocephalic and atraumatic.  Right Ear: No foreign bodies.  Left Ear: No foreign bodies.  Cerumen bilaterally.   After lavage, TMs clear  Neck: Neck supple.  Cardiovascular: Normal rate and regular rhythm.   Pulmonary/Chest: Effort normal and breath sounds normal.  Abdominal: Soft. Bowel sounds are normal. She exhibits no mass. There is no tenderness.  Musculoskeletal: She exhibits no edema.  Lymphadenopathy:    She has no cervical adenopathy.  Neurological:  She is alert and oriented to person, place, and time.  Skin: Skin is warm and dry.  Psychiatric: She has a normal mood and affect. Her behavior is normal.  Vitals reviewed.       Assessment & Plan:    Encounter Diagnoses  Name Primary?  Marland Kitchen Anxiety Yes  . Cerumen impaction, bilateral   . Special screening for malignant  neoplasms, colon     -iFOBT given -encouraged pt to continue to abstain from smoking -f/u OV 6 mo.  RTO sooner prn

## 2015-12-13 ENCOUNTER — Other Ambulatory Visit: Payer: Self-pay | Admitting: Physician Assistant

## 2015-12-13 DIAGNOSIS — Z1211 Encounter for screening for malignant neoplasm of colon: Secondary | ICD-10-CM

## 2015-12-13 LAB — IFOBT (OCCULT BLOOD): IMMUNOLOGICAL FECAL OCCULT BLOOD TEST: NEGATIVE

## 2016-05-17 ENCOUNTER — Ambulatory Visit: Payer: Self-pay | Admitting: Physician Assistant

## 2016-05-21 ENCOUNTER — Ambulatory Visit: Payer: Self-pay | Admitting: Physician Assistant

## 2016-05-21 ENCOUNTER — Encounter: Payer: Self-pay | Admitting: Physician Assistant

## 2016-05-21 VITALS — BP 118/66 | HR 77 | Temp 97.9°F | Ht 62.0 in | Wt 126.4 lb

## 2016-05-21 DIAGNOSIS — F419 Anxiety disorder, unspecified: Secondary | ICD-10-CM

## 2016-05-21 DIAGNOSIS — J449 Chronic obstructive pulmonary disease, unspecified: Secondary | ICD-10-CM

## 2016-05-21 DIAGNOSIS — F32A Depression, unspecified: Secondary | ICD-10-CM

## 2016-05-21 DIAGNOSIS — F329 Major depressive disorder, single episode, unspecified: Secondary | ICD-10-CM

## 2016-05-21 NOTE — Progress Notes (Signed)
BP 118/66 mmHg  Pulse 77  Temp(Src) 97.9 F (36.6 C)  Ht 5\' 2"  (1.575 m)  Wt 126 lb 6.4 oz (57.335 kg)  BMI 23.11 kg/m2  SpO2 92%   Subjective:    Patient ID: Diane Simmons, female    DOB: Oct 19, 1956, 60 y.o.   MRN: [id number]  HPI: Diane SheffieldVictoria W Schrier is a 60 y.o. female presenting on 05/21/2016 for Mental Health Problem   HPI  Pt still seeing [facility] for her mood (her citalopram and xanax)  She has been exercising to get her wt down  She is smoking one cigarette once/twice week with coffee.  Her breathing is much better now that she isn't smoking regularly.  Pt [facility]  Labs good 05/2015- cbc, cmp, lipids  Relevant past medical, surgical, family and social history reviewed and updated as indicated. Interim medical history since our last visit reviewed. Allergies and medications reviewed and updated.   Current outpatient prescriptions:  .  albuterol (PROVENTIL) (2.5 MG/3ML) 0.083% nebulizer solution, Take 3 mLs (2.5 mg total) by nebulization every 6 (six) hours as needed for wheezing or shortness of breath., Disp: 75 mL, Rfl: 12 .  alprazolam (XANAX) 2 MG tablet, Take by mouth 2 (two) times daily as needed for anxiety. 1 mg in the morning and 2 mg at bedtime., Disp: , Rfl:  .  citalopram (CELEXA) 40 MG tablet, Take 1 tablet (40 mg total) by mouth daily., Disp: 30 tablet, Rfl: 6   Review of Systems  Constitutional: Negative for fever, chills, diaphoresis, appetite change, fatigue and unexpected weight change.  HENT: Negative for congestion, dental problem, drooling, ear pain, facial swelling, hearing loss, mouth sores, sneezing, sore throat, trouble swallowing and voice change.   Eyes: Negative for pain, discharge, redness, itching and visual disturbance.  Respiratory: Negative for cough, choking, shortness of breath and wheezing.   Cardiovascular: Negative for chest pain, palpitations and leg swelling.  Gastrointestinal: Negative for vomiting,  abdominal pain, diarrhea, constipation and blood in stool.  Endocrine: Negative for cold intolerance, heat intolerance and polydipsia.  Genitourinary: Negative for dysuria, hematuria and decreased urine volume.  Musculoskeletal: Negative for back pain, arthralgias and gait problem.  Skin: Negative for rash.  Allergic/Immunologic: Negative for environmental allergies.  Neurological: Negative for seizures, syncope, light-headedness and headaches.  Hematological: Negative for adenopathy.  Psychiatric/Behavioral: Negative for suicidal ideas, dysphoric mood and agitation. The patient is nervous/anxious.     Per HPI unless specifically indicated above     Objective:    BP 118/66 mmHg  Pulse 77  Temp(Src) 97.9 F (36.6 C)  Ht 5\' 2"  (1.575 m)  Wt 126 lb 6.4 oz (57.335 kg)  BMI 23.11 kg/m2  SpO2 92%  Wt Readings from Last 3 Encounters:  05/21/16 126 lb 6.4 oz (57.335 kg)  11/17/15 138 lb 9.6 oz (62.869 kg)  06/01/15 130 lb (58.968 kg)    Physical Exam  Constitutional: She is oriented to person, place, and time. She appears well-developed and well-nourished.  HENT:  Head: Normocephalic and atraumatic.  Neck: Neck supple.  Cardiovascular: Normal rate and regular rhythm.   Pulmonary/Chest: Effort normal and breath sounds normal.  Abdominal: Soft. Bowel sounds are normal. She exhibits no mass. There is no hepatosplenomegaly. There is no tenderness.  Musculoskeletal: She exhibits no edema.  Lymphadenopathy:    She has no cervical adenopathy.  Neurological: She is alert and oriented to person, place, and time.  Skin: Skin is warm and dry.  Psychiatric: She has a normal mood and affect. Her behavior is normal.  Vitals reviewed.       Assessment & Plan:   Encounter Diagnoses  Name Primary?  . Chronic obstructive pulmonary disease, unspecified COPD type (HCC) Yes  . Depression   . Anxiety      -urged pt to stop smoking altogether to help breathing -F/u 6 months.  RTO sooner  prn

## 2016-07-31 ENCOUNTER — Other Ambulatory Visit (HOSPITAL_COMMUNITY): Payer: Self-pay | Admitting: *Deleted

## 2016-07-31 DIAGNOSIS — Z1231 Encounter for screening mammogram for malignant neoplasm of breast: Secondary | ICD-10-CM

## 2016-08-02 ENCOUNTER — Encounter (HOSPITAL_COMMUNITY): Payer: PRIVATE HEALTH INSURANCE

## 2016-08-02 ENCOUNTER — Ambulatory Visit (HOSPITAL_COMMUNITY)
Admission: RE | Admit: 2016-08-02 | Discharge: 2016-08-02 | Disposition: A | Discharge status: Home / Self Care | Payer: PRIVATE HEALTH INSURANCE | Source: Ambulatory Visit | Attending: *Deleted | Admitting: *Deleted

## 2016-08-02 ENCOUNTER — Other Ambulatory Visit (HOSPITAL_COMMUNITY): Payer: Self-pay | Admitting: *Deleted

## 2016-08-02 DIAGNOSIS — Z1231 Encounter for screening mammogram for malignant neoplasm of breast: Secondary | ICD-10-CM | POA: Diagnosis not present

## 2016-11-20 ENCOUNTER — Ambulatory Visit: Payer: Self-pay | Admitting: Physician Assistant

## 2017-08-08 ENCOUNTER — Other Ambulatory Visit (HOSPITAL_COMMUNITY): Payer: Self-pay | Admitting: *Deleted

## 2017-08-08 DIAGNOSIS — Z1231 Encounter for screening mammogram for malignant neoplasm of breast: Secondary | ICD-10-CM

## 2017-08-14 ENCOUNTER — Ambulatory Visit (HOSPITAL_COMMUNITY)
Admission: RE | Admit: 2017-08-14 | Discharge: 2017-08-14 | Disposition: A | Discharge status: Home / Self Care | Payer: PRIVATE HEALTH INSURANCE | Source: Ambulatory Visit | Attending: *Deleted | Admitting: *Deleted

## 2017-08-14 DIAGNOSIS — Z1231 Encounter for screening mammogram for malignant neoplasm of breast: Secondary | ICD-10-CM | POA: Insufficient documentation

## 2017-08-16 ENCOUNTER — Other Ambulatory Visit (HOSPITAL_COMMUNITY): Payer: Self-pay | Admitting: *Deleted

## 2017-08-16 DIAGNOSIS — R928 Other abnormal and inconclusive findings on diagnostic imaging of breast: Secondary | ICD-10-CM

## 2017-08-16 DIAGNOSIS — R921 Mammographic calcification found on diagnostic imaging of breast: Secondary | ICD-10-CM

## 2017-08-20 ENCOUNTER — Encounter (HOSPITAL_COMMUNITY): Payer: PRIVATE HEALTH INSURANCE

## 2017-08-27 ENCOUNTER — Ambulatory Visit (HOSPITAL_COMMUNITY)
Admission: RE | Admit: 2017-08-27 | Discharge: 2017-08-27 | Disposition: A | Discharge status: Home / Self Care | Payer: PRIVATE HEALTH INSURANCE | Source: Ambulatory Visit | Attending: *Deleted | Admitting: *Deleted

## 2017-08-27 DIAGNOSIS — R928 Other abnormal and inconclusive findings on diagnostic imaging of breast: Secondary | ICD-10-CM

## 2017-08-27 DIAGNOSIS — R921 Mammographic calcification found on diagnostic imaging of breast: Secondary | ICD-10-CM | POA: Insufficient documentation

## 2018-08-21 ENCOUNTER — Other Ambulatory Visit (HOSPITAL_COMMUNITY): Payer: Self-pay | Admitting: *Deleted

## 2018-08-21 DIAGNOSIS — Z1231 Encounter for screening mammogram for malignant neoplasm of breast: Secondary | ICD-10-CM

## 2018-08-28 ENCOUNTER — Ambulatory Visit (HOSPITAL_COMMUNITY)
Admission: RE | Admit: 2018-08-28 | Discharge: 2018-08-28 | Disposition: A | Discharge status: Home / Self Care | Payer: PRIVATE HEALTH INSURANCE | Source: Ambulatory Visit | Attending: *Deleted | Admitting: *Deleted

## 2018-08-28 DIAGNOSIS — Z1231 Encounter for screening mammogram for malignant neoplasm of breast: Secondary | ICD-10-CM | POA: Insufficient documentation

## 2019-09-25 ENCOUNTER — Other Ambulatory Visit (HOSPITAL_COMMUNITY): Payer: Self-pay | Admitting: Physician Assistant

## 2019-09-25 DIAGNOSIS — Z1231 Encounter for screening mammogram for malignant neoplasm of breast: Secondary | ICD-10-CM

## 2019-10-01 ENCOUNTER — Ambulatory Visit (HOSPITAL_COMMUNITY)
Admission: RE | Admit: 2019-10-01 | Discharge: 2019-10-01 | Disposition: A | Discharge status: Home / Self Care | Payer: Medicare Other | Source: Ambulatory Visit | Attending: Physician Assistant | Admitting: Physician Assistant

## 2019-10-01 ENCOUNTER — Other Ambulatory Visit: Payer: Self-pay

## 2019-10-01 DIAGNOSIS — Z1231 Encounter for screening mammogram for malignant neoplasm of breast: Secondary | ICD-10-CM | POA: Diagnosis present

## 2020-08-23 ENCOUNTER — Other Ambulatory Visit (HOSPITAL_COMMUNITY): Payer: Self-pay | Admitting: Physician Assistant

## 2020-08-23 DIAGNOSIS — Z1231 Encounter for screening mammogram for malignant neoplasm of breast: Secondary | ICD-10-CM

## 2020-10-03 ENCOUNTER — Other Ambulatory Visit: Payer: Self-pay

## 2020-10-03 ENCOUNTER — Ambulatory Visit (HOSPITAL_COMMUNITY)
Admission: RE | Admit: 2020-10-03 | Discharge: 2020-10-03 | Disposition: A | Discharge status: Home / Self Care | Payer: Medicare Other | Source: Ambulatory Visit | Attending: Physician Assistant | Admitting: Physician Assistant

## 2020-10-03 DIAGNOSIS — Z1231 Encounter for screening mammogram for malignant neoplasm of breast: Secondary | ICD-10-CM | POA: Diagnosis present

## 2021-09-25 ENCOUNTER — Other Ambulatory Visit (HOSPITAL_COMMUNITY): Payer: Self-pay | Admitting: Physician Assistant

## 2021-09-25 DIAGNOSIS — Z1231 Encounter for screening mammogram for malignant neoplasm of breast: Secondary | ICD-10-CM

## 2021-10-09 ENCOUNTER — Other Ambulatory Visit: Payer: Self-pay

## 2021-10-09 ENCOUNTER — Ambulatory Visit (HOSPITAL_COMMUNITY)
Admission: RE | Admit: 2021-10-09 | Discharge: 2021-10-09 | Disposition: A | Discharge status: Home / Self Care | Payer: Medicare Other | Source: Ambulatory Visit | Attending: Physician Assistant | Admitting: Physician Assistant

## 2021-10-09 DIAGNOSIS — Z1231 Encounter for screening mammogram for malignant neoplasm of breast: Secondary | ICD-10-CM | POA: Insufficient documentation

## 2022-08-17 NOTE — H&P (Signed)
Surgical History & Physical  Patient Name: Diane Simmons  DOB: Jul 06, 1956  Surgery: Cataract extraction with intraocular lens implant phacoemulsification; Right Eye Surgeon: Ronn Melena MD Surgery Date: 08/24/2022 Pre-Op Date: 08/01/2022  HPI: A 47 Yr. old female patient presents for a cataract evaluation. Patient reports that she can hardly see OU. Patient states that her OD < OS. Patient reports that she has to sit directly in front of the television in order to see her favorite shows, [employer] and [employer]. Patient states that she is unable to see people at a distance. Patient states that her night vision is poor and is unable to drive. Patient states that she does not drive at all due to her vision being poor. Patient states that she has hazy vision that is so hazy that it is a blurred haze. Patient states that she is unable to order from fast food restaurants if it is not her regular fast food location because she is unable to read to the menu. Patient states that she is unable to produce tears.  Medical History: Cataracts Anxiety  ROS: Psychiatry Anxiety All recorded systems are negative except as noted above.  Social Current every day smoker  Medication Alprazolam, Citalopram, Betamethasone dipropionate   Sx/Procedures None  Drug Allergies  Erythromycin, Latex   ROS: History & Physical: Heent: cataracts NECK: supple without bruits LUNGS: lungs clear to auscultation CV: regular rate and rhythm Abdomen: soft and non-tender  Impression & Plan: Assessment: 1.  Myopia ; Left Eye (H52.12) 2.  CATARACT AGE-RELATED NUCLEAR; Both Eyes (H25.13) 3.  DRY EYE SYNDROME/TEAR FILM INSUFFICIENCY; Both Eyes (H04.123)  Plan: 1.  New MRx after surgery  2.  Cataracts are visually significant and account for the patient's complaints. Discussed all risks, benefits, procedures and recovery, including infection, loss of vision and eye, need for glasses after surgery or additional  procedures. Patient understands changing glasses will not improve vision. Patient indicated understanding of procedure. All questions answered. Patient desires to have surgery, recommend phacoemulsification with intraocular lens. Patient to have preliminary testing necessary (Argos/IOL Master, Mac OCT, TOPO) Educational materials provided: Cataract.  Plan: Proceed with surgery OD (mature cataract) and schedule OS aftewards Good Dilation Needs Omidria and trypan Return for repeat measurements [place] target with Rayner lens Last case of the day  3.  Findings, prognosis and treatment options reviewed.

## 2022-08-21 ENCOUNTER — Encounter (HOSPITAL_COMMUNITY)
Admission: RE | Admit: 2022-08-21 | Discharge: 2022-08-21 | Disposition: A | Discharge status: Home / Self Care | Payer: Medicare Other | Source: Ambulatory Visit | Attending: Optometry | Admitting: Optometry

## 2022-08-24 ENCOUNTER — Ambulatory Visit (HOSPITAL_BASED_OUTPATIENT_CLINIC_OR_DEPARTMENT_OTHER): Payer: Medicare Other | Admitting: Anesthesiology

## 2022-08-24 ENCOUNTER — Ambulatory Visit (HOSPITAL_COMMUNITY): Payer: Medicare Other | Admitting: Anesthesiology

## 2022-08-24 ENCOUNTER — Encounter (HOSPITAL_COMMUNITY)
Admission: RE | Disposition: A | Discharge status: Home / Self Care | Payer: Self-pay | Source: Ambulatory Visit | Attending: Optometry

## 2022-08-24 ENCOUNTER — Encounter (HOSPITAL_COMMUNITY): Payer: Self-pay | Admitting: Optometry

## 2022-08-24 ENCOUNTER — Ambulatory Visit (HOSPITAL_COMMUNITY)
Admission: RE | Admit: 2022-08-24 | Discharge: 2022-08-24 | Disposition: A | Discharge status: Home / Self Care | Payer: Medicare Other | Source: Ambulatory Visit | Attending: Optometry | Admitting: Optometry

## 2022-08-24 DIAGNOSIS — F418 Other specified anxiety disorders: Secondary | ICD-10-CM

## 2022-08-24 DIAGNOSIS — F419 Anxiety disorder, unspecified: Secondary | ICD-10-CM | POA: Diagnosis not present

## 2022-08-24 DIAGNOSIS — H25811 Combined forms of age-related cataract, right eye: Secondary | ICD-10-CM

## 2022-08-24 DIAGNOSIS — H04123 Dry eye syndrome of bilateral lacrimal glands: Secondary | ICD-10-CM | POA: Insufficient documentation

## 2022-08-24 DIAGNOSIS — H2521 Age-related cataract, morgagnian type, right eye: Secondary | ICD-10-CM | POA: Insufficient documentation

## 2022-08-24 DIAGNOSIS — F32A Depression, unspecified: Secondary | ICD-10-CM | POA: Diagnosis not present

## 2022-08-24 DIAGNOSIS — H5212 Myopia, left eye: Secondary | ICD-10-CM | POA: Diagnosis not present

## 2022-08-24 DIAGNOSIS — Z87891 Personal history of nicotine dependence: Secondary | ICD-10-CM

## 2022-08-24 DIAGNOSIS — F17200 Nicotine dependence, unspecified, uncomplicated: Secondary | ICD-10-CM | POA: Diagnosis not present

## 2022-08-24 HISTORY — PX: CATARACT EXTRACTION W/PHACO: SHX586

## 2022-08-24 SURGERY — PHACOEMULSIFICATION, CATARACT, WITH IOL INSERTION
Anesthesia: Monitor Anesthesia Care | Site: Eye | Laterality: Right

## 2022-08-24 MED ORDER — SODIUM CHLORIDE 0.9% FLUSH: INTRAVENOUS | Status: DC | PRN

## 2022-08-24 MED ORDER — MIDAZOLAM HCL 5 MG/5ML IJ SOLN: INTRAMUSCULAR | Status: DC | PRN

## 2022-08-24 MED ORDER — POVIDONE-IODINE 5 % OP SOLN
OPHTHALMIC | Status: DC | PRN
  Administered 2022-08-24: 1 via OPHTHALMIC

## 2022-08-24 MED ORDER — PHENYLEPHRINE-KETOROLAC 1-0.3 % IO SOLN
INTRAOCULAR | Status: AC
  Filled 2022-08-24: qty 4

## 2022-08-24 MED ORDER — SIGHTPATH DOSE#1 NA HYALUR & NA CHOND-NA HYALUR IO KIT
PACK | INTRAOCULAR | Status: DC | PRN
  Administered 2022-08-24 (×2): 1 via OPHTHALMIC

## 2022-08-24 MED ORDER — BSS IO SOLN
INTRAOCULAR | Status: DC | PRN
  Administered 2022-08-24: 15 mL via INTRAOCULAR

## 2022-08-24 MED ORDER — MIDAZOLAM HCL 2 MG/2ML IJ SOLN
INTRAMUSCULAR | Status: AC
  Filled 2022-08-24: qty 2

## 2022-08-24 MED ORDER — MOXIFLOXACIN HCL 0.5 % OP SOLN
OPHTHALMIC | Status: DC | PRN
  Administered 2022-08-24: 2 [drp] via OPHTHALMIC

## 2022-08-24 MED ORDER — PHENYLEPHRINE HCL 2.5 % OP SOLN
1.0000 [drp] | OPHTHALMIC | Status: AC | PRN
  Administered 2022-08-24 (×3): 1 [drp] via OPHTHALMIC

## 2022-08-24 MED ORDER — TROPICAMIDE 1 % OP SOLN
1.0000 [drp] | OPHTHALMIC | Status: AC | PRN
  Administered 2022-08-24 (×3): 1 [drp] via OPHTHALMIC

## 2022-08-24 MED ORDER — TETRACAINE HCL 0.5 % OP SOLN
1.0000 [drp] | OPHTHALMIC | Status: AC | PRN
  Administered 2022-08-24 (×3): 1 [drp] via OPHTHALMIC

## 2022-08-24 MED ORDER — LIDOCAINE HCL 3.5 % OP GEL
1.0000 | Freq: Once | OPHTHALMIC | Status: AC
  Administered 2022-08-24: 1 via OPHTHALMIC

## 2022-08-24 MED ORDER — TRYPAN BLUE 0.06 % IO SOSY
PREFILLED_SYRINGE | INTRAOCULAR | Status: DC | PRN
  Administered 2022-08-24: 0.5 mL via INTRAOCULAR

## 2022-08-24 MED ORDER — TRYPAN BLUE 0.06 % IO SOSY
PREFILLED_SYRINGE | INTRAOCULAR | Status: AC
  Filled 2022-08-24: qty 0.5

## 2022-08-24 MED ORDER — STERILE WATER FOR IRRIGATION IR SOLN
Status: DC | PRN
  Administered 2022-08-24: 500 mL

## 2022-08-24 MED ORDER — LIDOCAINE HCL (PF) 1 % IJ SOLN
INTRAOCULAR | Status: DC | PRN
  Administered 2022-08-24: 1 mL via OPHTHALMIC

## 2022-08-24 MED ORDER — EPINEPHRINE PF 1 MG/ML IJ SOLN
INTRAOCULAR | Status: DC | PRN
  Administered 2022-08-24: 500 mL

## 2022-08-24 SURGICAL SUPPLY — 16 items
CATARACT SUITE SIGHTPATH (MISCELLANEOUS) ×1 IMPLANT
CLOTH BEACON ORANGE TIMEOUT ST (SAFETY) ×1 IMPLANT
DRSG TEGADERM 4X4.75 (GAUZE/BANDAGES/DRESSINGS) ×1 IMPLANT
EYE SHIELD UNIVERSAL CLEAR (GAUZE/BANDAGES/DRESSINGS) IMPLANT
FEE CATARACT SUITE SIGHTPATH (MISCELLANEOUS) ×1 IMPLANT
GLOVE BIOGEL PI IND STRL 7.0 (GLOVE) ×2 IMPLANT
GLOVE SS BIOGEL STRL SZ 6.5 (GLOVE) IMPLANT
LENS IOL RAYNER 24.5 (Intraocular Lens) ×1 IMPLANT
LENS IOL RAYONE EMV 24.5 (Intraocular Lens) IMPLANT
NDL HYPO 18GX1.5 BLUNT FILL (NEEDLE) ×1 IMPLANT
NEEDLE HYPO 18GX1.5 BLUNT FILL (NEEDLE) ×1 IMPLANT
PAD ARMBOARD 7.5X6 YLW CONV (MISCELLANEOUS) ×1 IMPLANT
SYR TB 1ML LL NO SAFETY (SYRINGE) ×1 IMPLANT
TAPE SURG TRANSPORE 1 IN (GAUZE/BANDAGES/DRESSINGS) IMPLANT
TAPE SURGICAL TRANSPORE 1 IN (GAUZE/BANDAGES/DRESSINGS) ×1
WATER STERILE IRR 500ML POUR (IV SOLUTION) IMPLANT

## 2022-08-24 NOTE — H&P (Signed)
The H and P was reviewed and updated. The patient was examined.  No changes were found after exam.  The surgical eye was marked.  

## 2022-08-24 NOTE — Anesthesia Preprocedure Evaluation (Signed)
Anesthesia Evaluation  Patient identified by MRN, date of birth, ID band Patient awake    Reviewed: Allergy & Precautions, H&P , NPO status , Patient's Chart, lab work & pertinent test results, reviewed documented beta blocker date and time   Airway Mallampati: II  TM Distance: >3 FB Neck ROM: full    Dental no notable dental hx.    Pulmonary neg pulmonary ROS, Patient abstained from smoking., former smoker,    Pulmonary exam normal breath sounds clear to auscultation       Cardiovascular Exercise Tolerance: Good negative cardio ROS   Rhythm:regular Rate:Normal     Neuro/Psych PSYCHIATRIC DISORDERS Anxiety Depression negative neurological ROS     GI/Hepatic negative GI ROS, Neg liver ROS,   Endo/Other  negative endocrine ROS  Renal/GU negative Renal ROS  negative genitourinary   Musculoskeletal   Abdominal   Peds  Hematology negative hematology ROS (+)   Anesthesia Other Findings   Reproductive/Obstetrics negative OB ROS                             Anesthesia Physical Anesthesia Plan  ASA: 2  Anesthesia Plan: MAC   Post-op Pain Management:    Induction:   PONV Risk Score and Plan:   Airway Management Planned:   Additional Equipment:   Intra-op Plan:   Post-operative Plan:   Informed Consent: I have reviewed the patients History and Physical, chart, labs and discussed the procedure including the risks, benefits and alternatives for the proposed anesthesia with the patient or authorized representative who has indicated his/her understanding and acceptance.     Dental Advisory Given  Plan Discussed with: CRNA  Anesthesia Plan Comments:         Anesthesia Quick Evaluation

## 2022-08-24 NOTE — Op Note (Signed)
Date of procedure: 08/24/22  Pre-operative diagnosis: Mature Visually significant age-related cataract, Right Eye (H25.21)  Post-operative diagnosis: Mature Visually significant age-related cataract, Right Eye  Procedure: Removal of cataract via phacoemulsification and insertion of intra-ocular lens Rayner [id number] +24.5D into the capsular bag of the Right Eye  Attending surgeon: Ronn Melena, MD  Anesthesia: MAC, Topical Akten  Complications: None  Estimated Blood Loss: <70m (minimal)  Specimens: None  Implants:  Implant Name Type Inv. Item Serial No. Manufacturer Lot No. LRB No. Used Action  LENS IOL RAYNER 24.5 - S07 Intraocular Lens LENS IOL RAYNER 24.5 07 [employer] [id number] Implanted    Indications:  Visually significant age-related cataract, Right Eye  Procedure:  The patient was seen and identified in the pre-operative area. The operative eye was identified and dilated.  The operative eye was marked.  Topical anesthesia was administered to the operative eye.     The patient was then to the operative suite and placed in the supine position.  A timeout was performed confirming the patient, procedure to be performed, and all other relevant information.   The patient's face was prepped and draped in the usual fashion for intra-ocular surgery.  A lid speculum was placed into the operative eye and the surgical microscope moved into place and focused.  A lack of red reflex due to a mature cataract was confirmed.  A superotemporal paracentesis was created using a 20 gauge paracentesis blade.    Shugarcaine was injected into the anterior chamber. Vision blue was injected into the anterior chamber. BSS mixed with Omidria, followed by 1% lidocaine was injected into the anterior chamber.  Viscoelastic was injected into the anterior chamber.  A temporal clear-corneal main wound incision was created using a 2.474mmicrokeratome.  A continuous curvilinear capsulorrhexis was initiated  using an irrigating cystitome and completed using capsulorrhexis forceps.  Hydrodissection and hydrodeliniation were performed.  Viscoelastic was injected into the anterior chamber.  A phacoemulsification handpiece and a chopper as a second instrument were used to remove the nucleus and epinucleus. The irrigation/aspiration handpiece was used to remove any remaining cortical material.   The capsular bag was reinflated with viscoelastic, checked, and found to be intact.  The intraocular lens was inserted into the capsular bag.  The irrigation/aspiration handpiece was used to remove any remaining viscoelastic.  The clear corneal wound and paracentesis wounds were then hydrated and checked with Weck-Cels to be watertight.  The lid-speculum and drape was removed, and the patient's face was cleaned with a wet and dry 4x4.  Moxifloxacin drops were instilled onto the eye. A clear shield was taped over the eye. The patient was taken to the post-operative care unit in good condition, having tolerated the procedure well.  Post-Op Instructions: The [facility] post-operative evaluation and will receive all other orders and instructions.

## 2022-08-24 NOTE — Discharge Instructions (Signed)
Please discharge patient when stable, will follow up today with Dr. Ethlyn Alto at the [facility] [place] office immediately following discharge.  Leave shield in place until visit.  All paperwork with discharge instructions will be given at the office.  [facility] Address:  [address]  [place], [place] [address]  

## 2022-08-24 NOTE — Anesthesia Postprocedure Evaluation (Signed)
Anesthesia Post Note  Patient: Diane Simmons  Procedure(s) Performed: CATARACT EXTRACTION PHACO AND INTRAOCULAR LENS PLACEMENT (IOC) (Right: Eye)  Patient location during evaluation: Short Stay Anesthesia Type: MAC Level of consciousness: awake and alert Pain management: pain level controlled Vital Signs Assessment: post-procedure vital signs reviewed and stable Respiratory status: spontaneous breathing Cardiovascular status: blood pressure returned to baseline Postop Assessment: no apparent nausea or vomiting Anesthetic complications: no   No notable events documented.   Last Vitals:  Vitals:   08/24/22 1115 08/24/22 1217  BP: 110/70 109/67  Pulse: (!) 58 61  Resp: 18 18  Temp: 36.5 C 36.5 C  SpO2: 97% 95%    Last Pain:  Vitals:   08/24/22 1217  TempSrc: Axillary  PainSc: 0-No pain                 Lawana Hartzell

## 2022-08-24 NOTE — Transfer of Care (Signed)
Immediate Anesthesia Transfer of Care Note  Patient: Diane Simmons  Procedure(s) Performed: CATARACT EXTRACTION PHACO AND INTRAOCULAR LENS PLACEMENT ([place]) (Right: Eye)  Patient Location: PACU and Short Stay  Anesthesia Type:MAC  Level of Consciousness: awake  Airway & Oxygen Therapy: Patient Spontanous Breathing  Post-op Assessment: Report given to RN  Post vital signs: Reviewed and stable  Last Vitals:  Vitals Value Taken Time  BP 109/67 08/24/22 1217  Temp 36.5 C 08/24/22 1217  Pulse 61 08/24/22 1217  Resp 18 08/24/22 1217  SpO2 95 % 08/24/22 1217    Last Pain:  Vitals:   08/24/22 1217  TempSrc: Axillary  PainSc: 0-No pain      Patients Stated Pain Goal: 7 (40/97/35 3299)  Complications: No notable events documented.

## 2022-08-27 ENCOUNTER — Other Ambulatory Visit (HOSPITAL_COMMUNITY): Payer: Self-pay | Admitting: Internal Medicine

## 2022-08-27 DIAGNOSIS — Z1231 Encounter for screening mammogram for malignant neoplasm of breast: Secondary | ICD-10-CM

## 2022-08-28 ENCOUNTER — Encounter (HOSPITAL_COMMUNITY): Payer: Self-pay | Admitting: Optometry

## 2022-09-05 NOTE — H&P (Signed)
Surgical History & Physical  Patient Name: Diane Simmons DOB: [birth year]  Surgery: Cataract extraction with intraocular lens implant phacoemulsification; Left Eye Surgeon: Ronn Melena MD Surgery Date: 09/14/2022 Pre-Op Date: 08/29/2022  HPI: A 82 Yr. old female patient 1. The patient is returning after cataract surgery. The right eye is affected. Status post cataract surgery, which began 5 days ago: Since the last visit, the affected area is doing well. Patient is following medication instructions: Combo QID OD. 2. 2. Having hazy vision left eye due to cataract. Difficulties recognizing peoples faces at a distance. This is negatively affecting the patient's quality of life and the patient is unable to function adequately in life with the current level of vision. HPI was performed by Ronn Melena.  Medical History: Cataracts  General Health  Anxiety Psychiatry Anxiety  Review of Systems All recorded systems are negative except as noted above.  Social Current every day smoker / [id number] of Cigarettes   Medication Prednisolone-Moxifloxacin-Bromfenac,  Alprazolam, Citalopram, Betamethasone dipropionate   Sx/Procedures Phaco c IOL OD   Drug Allergies   Erythromycin, Latex  History & Physical: Heent: cataract OS, pseudophakia OD NECK: supple without bruits LUNGS: lungs clear to auscultation CV: regular rate and rhythm Abdomen: soft and non-tender  Impression & Plan: Assessment: 1.  CATARACT EXTRACTION STATUS; Right Eye (Z98.41) 2.  INTRAOCULAR LENS IOL ; Right Eye (Z96.1) 3.  CATARACT AGE-RELATED NUCLEAR; , Left Eye (H25.12)  Plan: 1,2.  POD5 post op exam. Doing well. All precautions discussed and instructions reviewed. Written instructions given. Patient will start Combo post op drop 8 times x3 days, then QID until next appt.  3.  Cataracts are visually significant and account for the patient's complaints. Discussed all risks, benefits, procedures and  recovery, including infection, loss of vision and eye, need for glasses after surgery or additional procedures. Patient understands changing glasses will not improve vision. Patient indicated understanding of procedure. All questions answered. Patient desires to have surgery, recommend phacoemulsification with intraocular lens. Patient to have preliminary testing necessary ([place]/IOL Master, Mac OCT, TOPO) Educational materials provided: Cataract.  Plan: Proceed with surgery OS as scheduled Good Dilation Needs Omidria and possible trypan Return for repeat measurements [place] target with Rayner lens

## 2022-09-07 ENCOUNTER — Encounter (HOSPITAL_COMMUNITY)
Admission: RE | Admit: 2022-09-07 | Discharge: 2022-09-07 | Disposition: A | Discharge status: Home / Self Care | Payer: Medicare Other | Source: Ambulatory Visit | Attending: Optometry | Admitting: Optometry

## 2022-09-14 ENCOUNTER — Ambulatory Visit (HOSPITAL_COMMUNITY): Payer: Medicare Other | Admitting: Anesthesiology

## 2022-09-14 ENCOUNTER — Encounter (HOSPITAL_COMMUNITY)
Admission: RE | Disposition: A | Discharge status: Home / Self Care | Payer: Self-pay | Source: Home / Self Care | Attending: Optometry

## 2022-09-14 ENCOUNTER — Encounter (HOSPITAL_COMMUNITY): Payer: Self-pay | Admitting: Optometry

## 2022-09-14 ENCOUNTER — Ambulatory Visit (HOSPITAL_BASED_OUTPATIENT_CLINIC_OR_DEPARTMENT_OTHER): Payer: Medicare Other | Admitting: Anesthesiology

## 2022-09-14 ENCOUNTER — Ambulatory Visit (HOSPITAL_COMMUNITY)
Admission: RE | Admit: 2022-09-14 | Discharge: 2022-09-14 | Disposition: A | Discharge status: Home / Self Care | Payer: Medicare Other | Attending: Optometry | Admitting: Optometry

## 2022-09-14 DIAGNOSIS — J449 Chronic obstructive pulmonary disease, unspecified: Secondary | ICD-10-CM | POA: Diagnosis not present

## 2022-09-14 DIAGNOSIS — F418 Other specified anxiety disorders: Secondary | ICD-10-CM

## 2022-09-14 DIAGNOSIS — F32A Depression, unspecified: Secondary | ICD-10-CM | POA: Insufficient documentation

## 2022-09-14 DIAGNOSIS — Z9841 Cataract extraction status, right eye: Secondary | ICD-10-CM | POA: Diagnosis not present

## 2022-09-14 DIAGNOSIS — F419 Anxiety disorder, unspecified: Secondary | ICD-10-CM | POA: Insufficient documentation

## 2022-09-14 DIAGNOSIS — Z87891 Personal history of nicotine dependence: Secondary | ICD-10-CM | POA: Diagnosis not present

## 2022-09-14 DIAGNOSIS — Z961 Presence of intraocular lens: Secondary | ICD-10-CM | POA: Diagnosis not present

## 2022-09-14 DIAGNOSIS — H2512 Age-related nuclear cataract, left eye: Secondary | ICD-10-CM

## 2022-09-14 DIAGNOSIS — H25812 Combined forms of age-related cataract, left eye: Secondary | ICD-10-CM

## 2022-09-14 HISTORY — PX: CATARACT EXTRACTION W/PHACO: SHX586

## 2022-09-14 SURGERY — PHACOEMULSIFICATION, CATARACT, WITH IOL INSERTION
Anesthesia: Monitor Anesthesia Care | Site: Eye | Laterality: Left

## 2022-09-14 MED ORDER — LIDOCAINE HCL 3.5 % OP GEL
1.0000 | Freq: Once | OPHTHALMIC | Status: AC
  Administered 2022-09-14: 1 via OPHTHALMIC

## 2022-09-14 MED ORDER — FENTANYL CITRATE (PF) 100 MCG/2ML IJ SOLN
INTRAMUSCULAR | Status: DC | PRN
  Administered 2022-09-14: 50 ug via INTRAVENOUS

## 2022-09-14 MED ORDER — FENTANYL CITRATE (PF) 100 MCG/2ML IJ SOLN
INTRAMUSCULAR | Status: AC
  Filled 2022-09-14: qty 2

## 2022-09-14 MED ORDER — LIDOCAINE HCL (PF) 1 % IJ SOLN
INTRAMUSCULAR | Status: DC | PRN
  Administered 2022-09-14: 1 mL

## 2022-09-14 MED ORDER — MIDAZOLAM HCL 2 MG/2ML IJ SOLN
INTRAMUSCULAR | Status: AC
  Filled 2022-09-14: qty 2

## 2022-09-14 MED ORDER — PHENYLEPHRINE HCL 2.5 % OP SOLN
1.0000 [drp] | OPHTHALMIC | Status: AC | PRN
  Administered 2022-09-14 (×3): 1 [drp] via OPHTHALMIC

## 2022-09-14 MED ORDER — TROPICAMIDE 1 % OP SOLN
1.0000 [drp] | OPHTHALMIC | Status: AC | PRN
  Administered 2022-09-14 (×3): 1 [drp] via OPHTHALMIC
  Filled 2022-09-14: qty 3

## 2022-09-14 MED ORDER — EPINEPHRINE PF 1 MG/ML IJ SOLN
INTRAMUSCULAR | Status: AC
  Filled 2022-09-14: qty 1

## 2022-09-14 MED ORDER — BSS IO SOLN
INTRAOCULAR | Status: DC | PRN
  Administered 2022-09-14: 15 mL via INTRAOCULAR

## 2022-09-14 MED ORDER — PHENYLEPHRINE-KETOROLAC 1-0.3 % IO SOLN
INTRAOCULAR | Status: AC
  Filled 2022-09-14: qty 4

## 2022-09-14 MED ORDER — SODIUM CHLORIDE 0.9% FLUSH
INTRAVENOUS | Status: DC | PRN
  Administered 2022-09-14: 3 mL via INTRAVENOUS

## 2022-09-14 MED ORDER — SIGHTPATH DOSE#1 NA HYALUR & NA CHOND-NA HYALUR IO KIT
PACK | INTRAOCULAR | Status: DC | PRN
  Administered 2022-09-14: 1 via OPHTHALMIC

## 2022-09-14 MED ORDER — MIDAZOLAM HCL 5 MG/5ML IJ SOLN
INTRAMUSCULAR | Status: DC | PRN
  Administered 2022-09-14: 1 mg via INTRAVENOUS

## 2022-09-14 MED ORDER — PHENYLEPHRINE-KETOROLAC 1-0.3 % IO SOLN
INTRAOCULAR | Status: DC | PRN
  Administered 2022-09-14: 500 mL via OPHTHALMIC

## 2022-09-14 MED ORDER — POVIDONE-IODINE 5 % OP SOLN
OPHTHALMIC | Status: DC | PRN
  Administered 2022-09-14: 1 via OPHTHALMIC

## 2022-09-14 MED ORDER — MOXIFLOXACIN HCL 0.5 % OP SOLN
OPHTHALMIC | Status: DC | PRN
  Administered 2022-09-14: 2 [drp] via OPHTHALMIC

## 2022-09-14 MED ORDER — STERILE WATER FOR IRRIGATION IR SOLN
Status: DC | PRN
  Administered 2022-09-14: 50 mL

## 2022-09-14 MED ORDER — TETRACAINE HCL 0.5 % OP SOLN
1.0000 [drp] | OPHTHALMIC | Status: AC | PRN
  Administered 2022-09-14 (×3): 1 [drp] via OPHTHALMIC

## 2022-09-14 SURGICAL SUPPLY — 16 items
CATARACT SUITE SIGHTPATH (MISCELLANEOUS) ×1 IMPLANT
CLOTH BEACON ORANGE TIMEOUT ST (SAFETY) ×1 IMPLANT
DRAPE HALF SHEET 40X57 (DRAPES) IMPLANT
DRSG TEGADERM 4X4.75 (GAUZE/BANDAGES/DRESSINGS) ×1 IMPLANT
EYE SHIELD UNIVERSAL CLEAR (GAUZE/BANDAGES/DRESSINGS) IMPLANT
FEE CATARACT SUITE SIGHTPATH (MISCELLANEOUS) ×1 IMPLANT
GLOVE SURG SS PI 7.0 STRL IVOR (GLOVE) IMPLANT
GLOVE SURG SS PI 7.5 STRL IVOR (GLOVE) IMPLANT
LENS IOL RAYNER 23.5 (Intraocular Lens) ×1 IMPLANT
LENS IOL RAYONE EMV 23.5 (Intraocular Lens) IMPLANT
NDL HYPO 18GX1.5 BLUNT FILL (NEEDLE) ×1 IMPLANT
NEEDLE HYPO 18GX1.5 BLUNT FILL (NEEDLE) ×1 IMPLANT
PAD ARMBOARD 7.5X6 YLW CONV (MISCELLANEOUS) ×1 IMPLANT
SYR TB 1ML LL NO SAFETY (SYRINGE) ×1 IMPLANT
TAPE SURG TRANSPORE 1 IN (GAUZE/BANDAGES/DRESSINGS) IMPLANT
TAPE SURGICAL TRANSPORE 1 IN (GAUZE/BANDAGES/DRESSINGS) ×1

## 2022-09-14 NOTE — Anesthesia Postprocedure Evaluation (Signed)
Anesthesia Post Note  Patient: Diane Simmons  Procedure(s) Performed: CATARACT EXTRACTION PHACO AND INTRAOCULAR LENS PLACEMENT ([place]) (Left: Eye)  Patient location during evaluation: Phase II Anesthesia Type: MAC Level of consciousness: awake and alert and oriented Pain management: pain level controlled Vital Signs Assessment: post-procedure vital signs reviewed and stable Respiratory status: spontaneous breathing, nonlabored ventilation and respiratory function stable Cardiovascular status: stable and blood pressure returned to baseline Postop Assessment: no apparent nausea or vomiting Anesthetic complications: no   No notable events documented.   Last Vitals:  Vitals:   09/14/22 1313 09/14/22 1315  BP: 110/68 110/68  Pulse: 66   Resp:    Temp: 36.6 C   SpO2: 96%     Last Pain:  Vitals:   09/14/22 1313  TempSrc: Oral  PainSc: 0-No pain                 Ross Bender C Damiana Berrian

## 2022-09-14 NOTE — Transfer of Care (Signed)
Immediate Anesthesia Transfer of Care Note  Patient: Diane Simmons  Procedure(s) Performed: CATARACT EXTRACTION PHACO AND INTRAOCULAR LENS PLACEMENT ([facility]) (Left: Eye)  Patient Location: Short Stay  Anesthesia Type:MAC  Level of Consciousness: awake, alert  and oriented  Airway & Oxygen Therapy: Patient Spontanous Breathing  Post-op Assessment: Report given to RN and Post -op Vital signs reviewed and stable  Post vital signs: Reviewed and stable  Last Vitals:  Vitals Value Taken Time  BP 109/69   Temp 98   Pulse 60   Resp 16   SpO2 96     Last Pain:  Vitals:   09/14/22 1126  TempSrc: Oral  PainSc: 0-No pain         Complications: No notable events documented.

## 2022-09-14 NOTE — Op Note (Signed)
Date of procedure: 09/14/22  Pre-operative diagnosis: Visually significant age-related nuclear cataract, Left Eye (H25.12)  Post-operative diagnosis: Visually significant age-related nuclear cataract, Left Eye  Procedure: Removal of cataract via phacoemulsification and insertion of intra-ocular lens Rayner [id number] +23.5D into the capsular bag of the Left Eye  Attending surgeon: Ronn Melena, MD  Anesthesia: MAC, Topical [place]  Complications: None  Estimated Blood Loss: <62m (minimal)  Specimens: None  Implants:  Implant Name Type Inv. Item Serial No. Manufacturer Lot No. LRB No. Used Action  LENS IOL RAYNER 23.5 - S76 Intraocular Lens LENS IOL RAYNER 23.5 76 [employer] [id number] Implanted    Indications:  Visually significant age-related cataract, Left Eye  Procedure:  The patient was seen and identified in the pre-operative area. The operative eye was identified and dilated.  The operative eye was marked.  Topical anesthesia was administered to the operative eye.     The patient was then to the operative suite and placed in the supine position.  A timeout was performed confirming the patient, procedure to be performed, and all other relevant information.   The patient's face was prepped and draped in the usual fashion for intra-ocular surgery.  A lid speculum was placed into the operative eye and the surgical microscope moved into place and focused.  An inferotemporal paracentesis was created using a 20 gauge paracentesis blade.  BSS mixed with Omidria, followed by 1% lidocaine was injected into the anterior chamber.  Viscoelastic was injected into the anterior chamber.  A temporal clear-corneal main wound incision was created using a 2.44mmicrokeratome.  A continuous curvilinear capsulorrhexis was initiated using an irrigating cystitome and completed using capsulorrhexis forceps.  Hydrodissection and hydrodeliniation were performed.  Viscoelastic was injected into the  anterior chamber.  A phacoemulsification handpiece and a chopper as a second instrument were used to remove the nucleus and epinucleus. The irrigation/aspiration handpiece was used to remove any remaining cortical material.   The capsular bag was reinflated with viscoelastic, checked, and found to be intact.  The intraocular lens was inserted into the capsular bag.  The irrigation/aspiration handpiece was used to remove any remaining viscoelastic.  The clear corneal wound and paracentesis wounds were then hydrated and checked with Weck-Cels to be watertight.  The lid-speculum and drape was removed, and the patient's face was cleaned with a wet and dry 4x4.  Moxifloxacin was instilled onto the eye. A clear shield was taped over the eye. The patient was taken to the post-operative care unit in good condition, having tolerated the procedure well.  Post-Op Instructions: [facility] and will receive all other orders and instructions.

## 2022-09-14 NOTE — Anesthesia Preprocedure Evaluation (Addendum)
Anesthesia Evaluation  Patient identified by MRN, date of birth, ID band Patient awake    Reviewed: Allergy & Precautions, NPO status , Patient's Chart, lab work & pertinent test results  Airway Mallampati: II  TM Distance: >3 FB Neck ROM: Full    Dental  (+) Dental Advisory Given, Missing   Pulmonary COPD,  COPD inhaler, Patient abstained from smoking., former smoker,    Pulmonary exam normal breath sounds clear to auscultation       Cardiovascular negative cardio ROS Normal cardiovascular exam Rhythm:Regular Rate:Normal     Neuro/Psych PSYCHIATRIC DISORDERS Anxiety Depression negative neurological ROS     GI/Hepatic negative GI ROS, Neg liver ROS,   Endo/Other  negative endocrine ROS  Renal/GU negative Renal ROS  negative genitourinary   Musculoskeletal negative musculoskeletal ROS (+)   Abdominal   Peds negative pediatric ROS (+)  Hematology negative hematology ROS (+)   Anesthesia Other Findings   Reproductive/Obstetrics negative OB ROS                           Anesthesia Physical Anesthesia Plan  ASA: 2  Anesthesia Plan: MAC   Post-op Pain Management: Minimal or no pain anticipated   Induction:   PONV Risk Score and Plan: Treatment may vary due to age or medical condition  Airway Management Planned: Nasal Cannula and Natural Airway  Additional Equipment:   Intra-op Plan:   Post-operative Plan:   Informed Consent: I have reviewed the patients History and Physical, chart, labs and discussed the procedure including the risks, benefits and alternatives for the proposed anesthesia with the patient or authorized representative who has indicated his/her understanding and acceptance.     Dental advisory given  Plan Discussed with: CRNA and Surgeon  Anesthesia Plan Comments:        Anesthesia Quick Evaluation

## 2022-09-14 NOTE — Discharge Instructions (Signed)
Please discharge patient when stable, will follow up today with Dr. Kathey Simer at the [facility] [place] office immediately following discharge.  Leave shield in place until visit.  All paperwork with discharge instructions will be given at the office.  [facility] [place] Address:  [address]  , [place] [address]  

## 2022-09-14 NOTE — Interval H&P Note (Signed)
History and Physical Interval Note:  09/14/2022 11:37 AM  The H and P was reviewed and updated. The patient was examined.  No changes were found after exam.  The surgical eye was marked.    Calypso Hagarty

## 2022-09-18 ENCOUNTER — Encounter (HOSPITAL_COMMUNITY): Payer: Self-pay | Admitting: Optometry

## 2022-10-10 ENCOUNTER — Ambulatory Visit (HOSPITAL_COMMUNITY): Payer: Medicare Other

## 2022-10-11 ENCOUNTER — Ambulatory Visit (HOSPITAL_COMMUNITY)
Admission: RE | Admit: 2022-10-11 | Discharge: 2022-10-11 | Disposition: A | Discharge status: Home / Self Care | Payer: Medicare Other | Source: Ambulatory Visit | Attending: Internal Medicine | Admitting: Internal Medicine

## 2022-10-11 DIAGNOSIS — Z1231 Encounter for screening mammogram for malignant neoplasm of breast: Secondary | ICD-10-CM | POA: Diagnosis present

## 2023-06-03 ENCOUNTER — Other Ambulatory Visit (HOSPITAL_COMMUNITY): Payer: Self-pay | Admitting: Internal Medicine

## 2023-06-03 DIAGNOSIS — R3129 Other microscopic hematuria: Secondary | ICD-10-CM

## 2023-07-01 IMAGING — MG MM DIGITAL SCREENING BILAT W/ TOMO AND CAD
8 series · 9 of 24 positions shown · non-contrast
Comparison: Previous exam(s).

CLINICAL DATA: Screening.

EXAM:
DIGITAL SCREENING BILATERAL MAMMOGRAM WITH TOMOSYNTHESIS AND CAD
TECHNIQUE: Bilateral screening digital craniocaudal and mediolateral oblique
mammograms were obtained. Bilateral screening digital breast
tomosynthesis was performed. The images were evaluated with
computer-aided detection.

[L CC synth-2D]
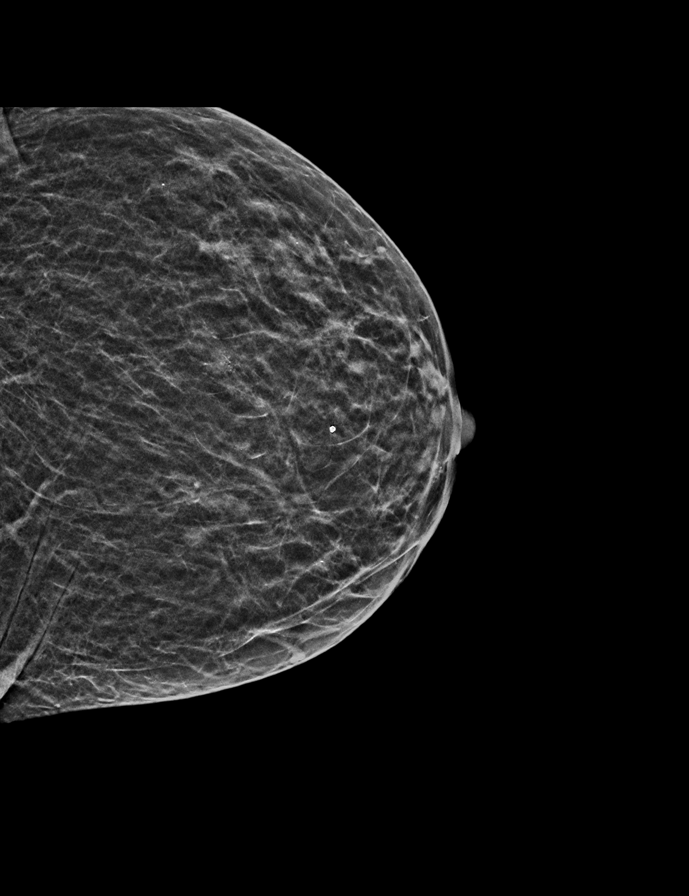

[R MLO synth-2D]
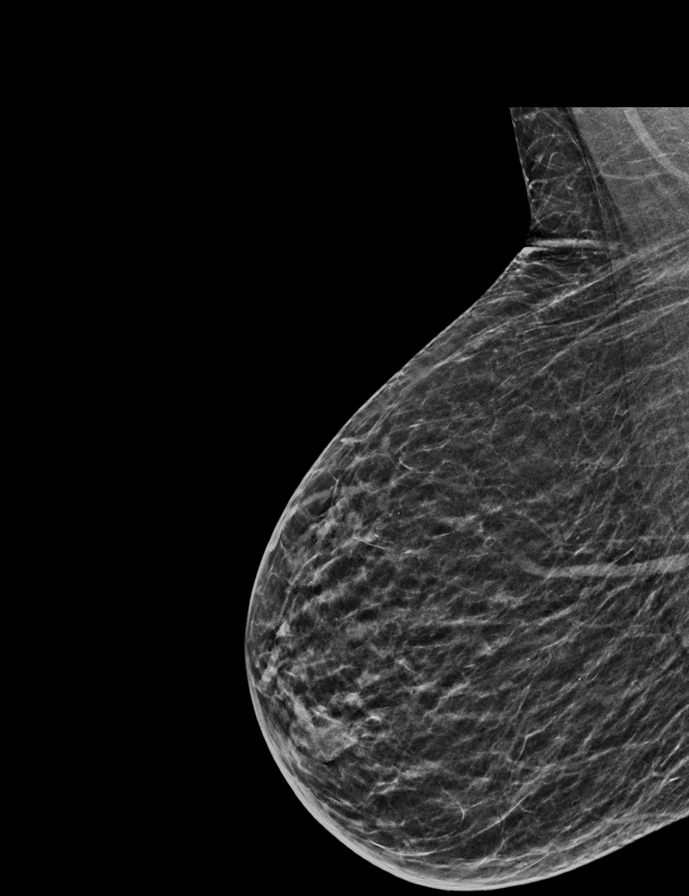

[R CC synth-2D]
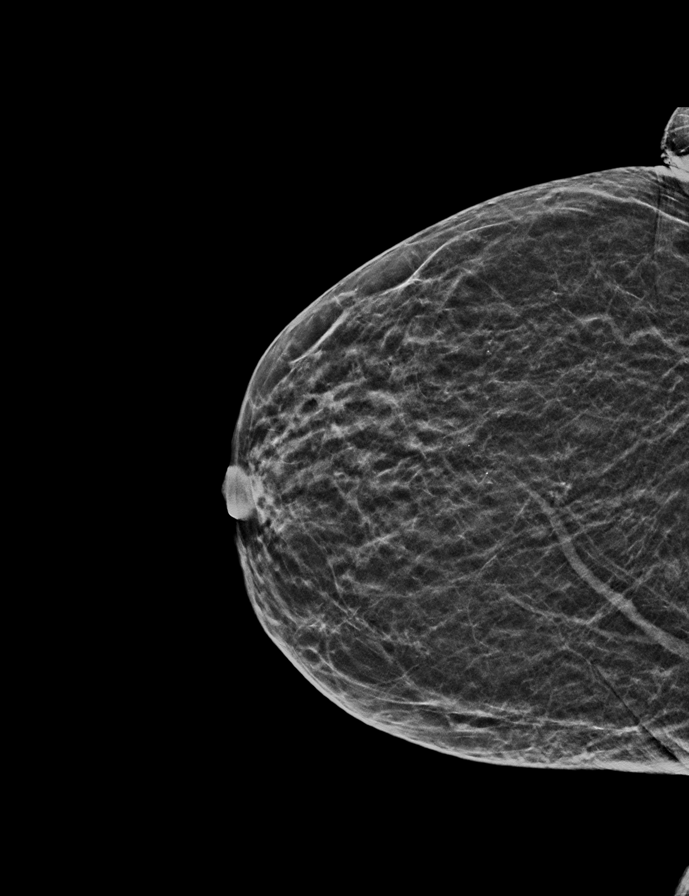

[L MLO synth-2D]
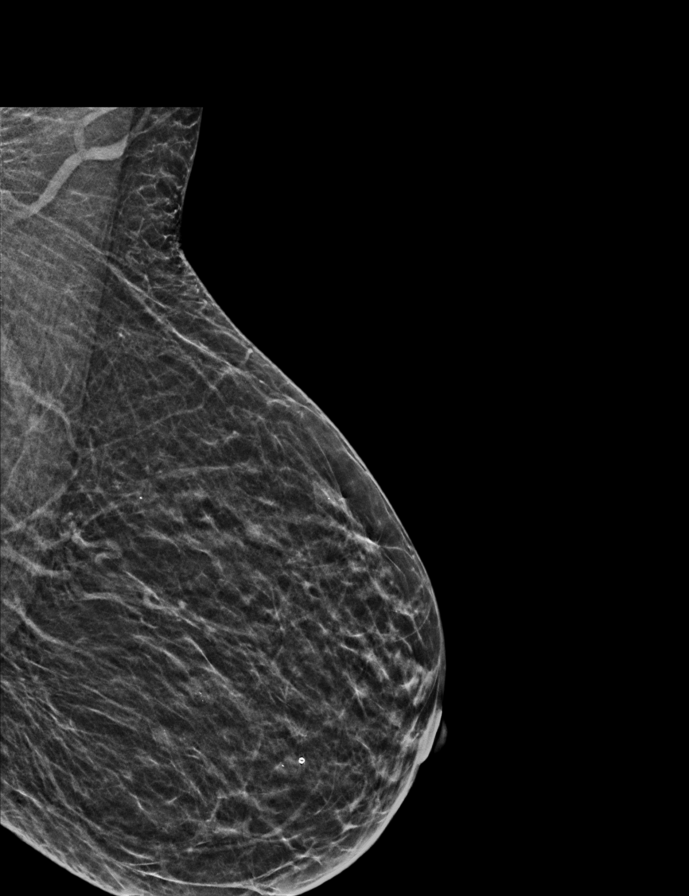

[R CC tomo · 2 of 33 frames shown]
[frame 11/33]
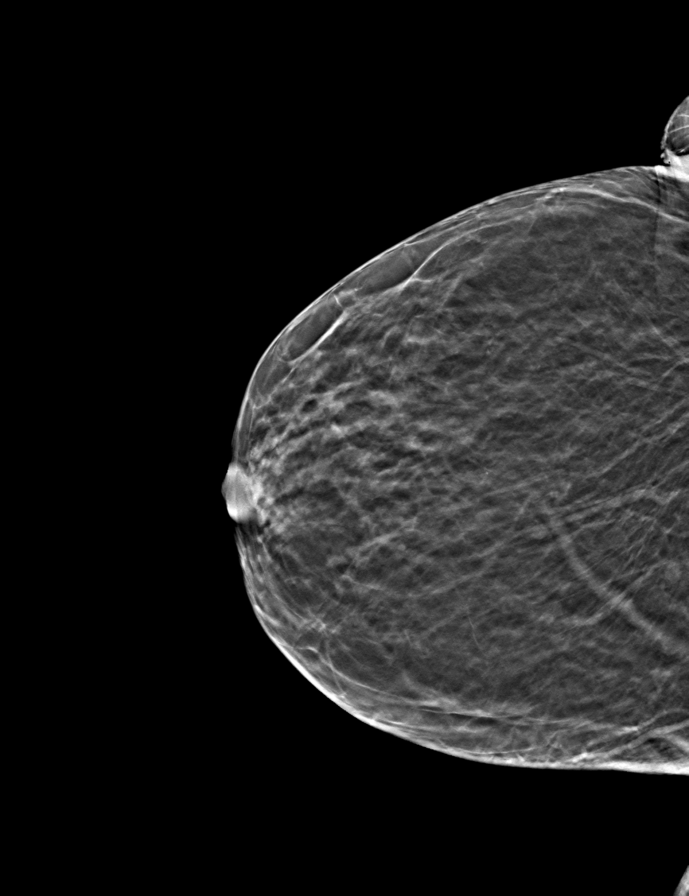
[frame 17/33]
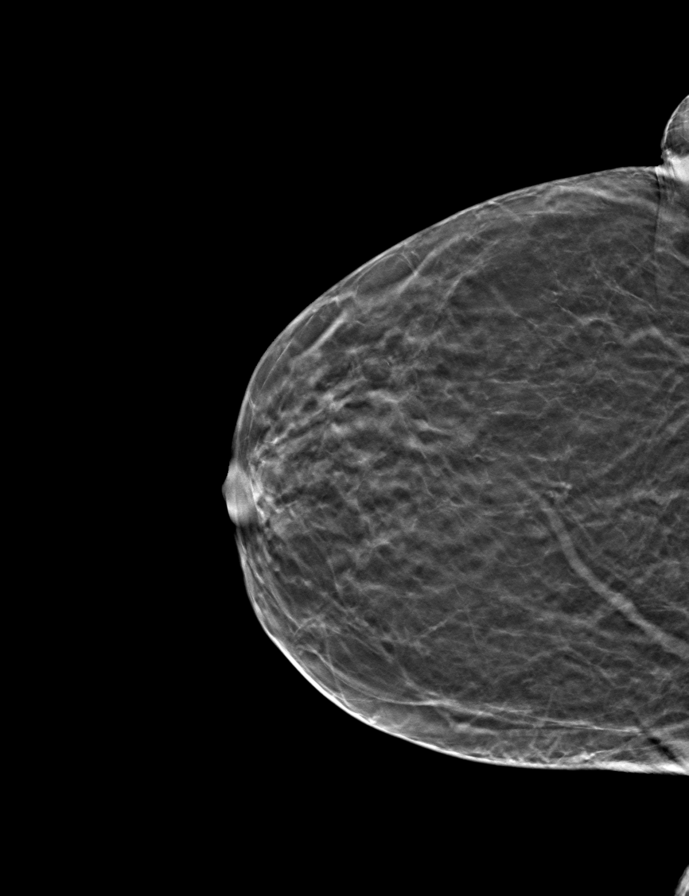

[R MLO tomo · tomo slice 18/35.0]
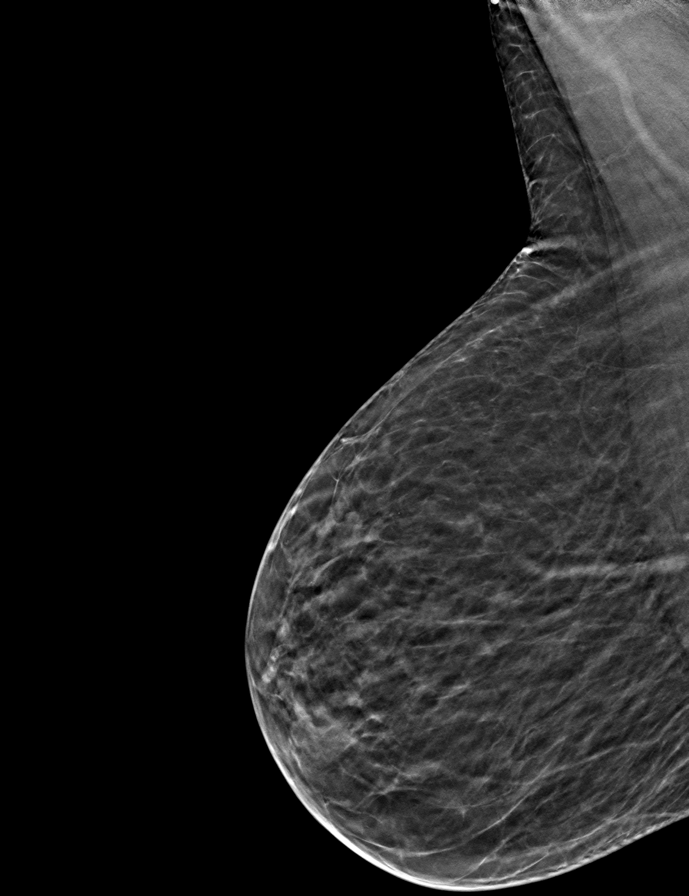

[L MLO tomo · tomo slice 17/33.0]
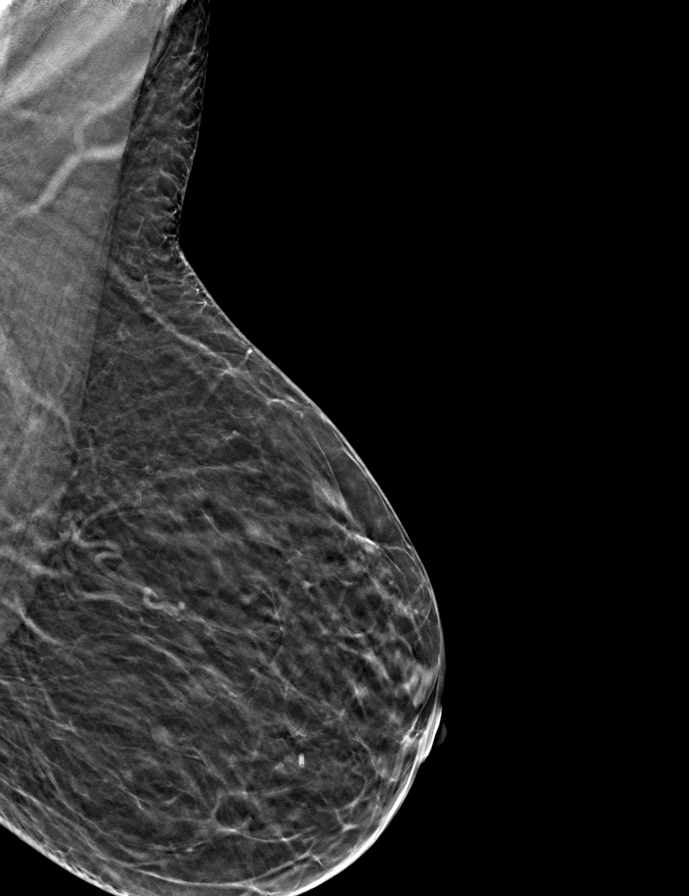

[L CC tomo · tomo slice 15/29.0]
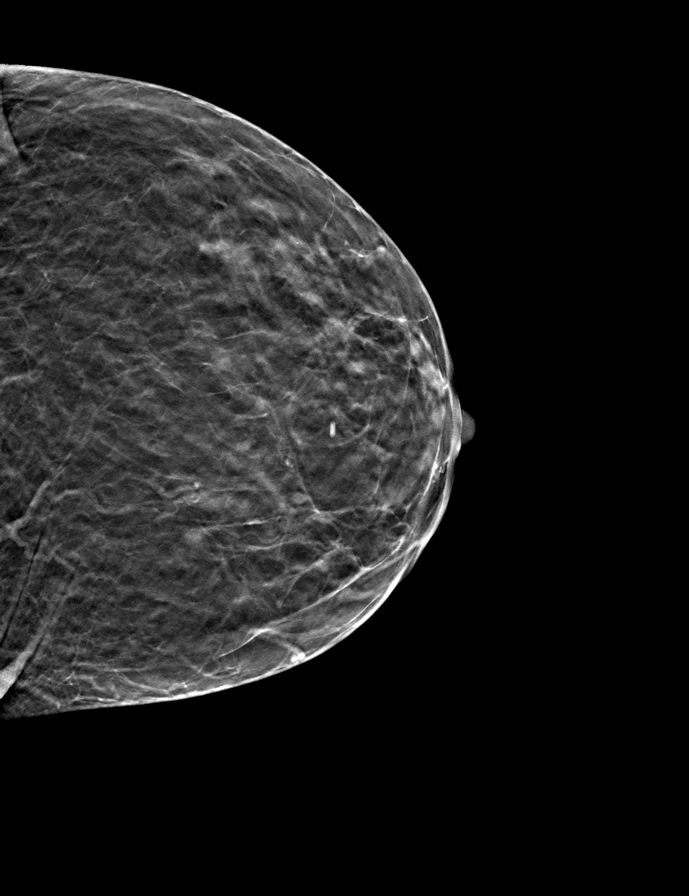

[9 of 24 positions shown; findings below may reference images not displayed]

ACR Breast Density Category b: There are scattered areas of
fibroglandular density.
FINDINGS: There are no findings suspicious for malignancy.
IMPRESSION: No mammographic evidence of malignancy. A result letter of this
screening mammogram will be mailed directly to the patient.

RECOMMENDATION:
Screening mammogram in one year. (Code:51-O-LD2)

BI-RADS CATEGORY  1: Negative.

## 2023-07-15 ENCOUNTER — Ambulatory Visit (HOSPITAL_COMMUNITY)
Admission: RE | Admit: 2023-07-15 | Discharge: 2023-07-15 | Disposition: A | Discharge status: Home / Self Care | Payer: Medicare Other | Source: Ambulatory Visit | Attending: Internal Medicine | Admitting: Internal Medicine

## 2023-07-15 DIAGNOSIS — R3129 Other microscopic hematuria: Secondary | ICD-10-CM | POA: Insufficient documentation

## 2023-07-15 MED ORDER — IOHEXOL 9 MG/ML PO SOLN
ORAL | Status: AC
  Filled 2023-07-15: qty 500

## 2023-08-14 ENCOUNTER — Ambulatory Visit (INDEPENDENT_AMBULATORY_CARE_PROVIDER_SITE_OTHER): Payer: Medicare Other | Admitting: Obstetrics & Gynecology

## 2023-08-14 ENCOUNTER — Encounter: Payer: Self-pay | Admitting: Obstetrics & Gynecology

## 2023-08-14 ENCOUNTER — Telehealth: Payer: Self-pay

## 2023-08-14 ENCOUNTER — Other Ambulatory Visit (HOSPITAL_COMMUNITY)
Admission: RE | Admit: 2023-08-14 | Discharge: 2023-08-14 | Disposition: A | Discharge status: Home / Self Care | Payer: Medicare Other | Source: Ambulatory Visit | Attending: Obstetrics & Gynecology | Admitting: Obstetrics & Gynecology

## 2023-08-14 VITALS — BP 98/61 | HR 61 | Wt 110.0 lb

## 2023-08-14 DIAGNOSIS — D259 Leiomyoma of uterus, unspecified: Secondary | ICD-10-CM | POA: Diagnosis not present

## 2023-08-14 DIAGNOSIS — Z124 Encounter for screening for malignant neoplasm of cervix: Secondary | ICD-10-CM | POA: Insufficient documentation

## 2023-08-14 DIAGNOSIS — Z1151 Encounter for screening for human papillomavirus (HPV): Secondary | ICD-10-CM | POA: Diagnosis not present

## 2023-08-14 DIAGNOSIS — Z01419 Encounter for gynecological examination (general) (routine) without abnormal findings: Secondary | ICD-10-CM | POA: Diagnosis present

## 2023-08-14 NOTE — Progress Notes (Signed)
   GYN VISIT Patient name: Diane Simmons MRN [id number]  Date of birth: 13-Dec-1955 Chief Complaint:   Fibroids  History of Present Illness:   Diane Simmons is a 67 y.o. G1P1001 PM female being seen today for the following concerns:  Due to microscopic hematuria, patient recently had CT that showed a uterine fibroid.  This was an incidental finding as she denies any vaginal bleeding, discharge, pelvic pain or irritation.  Upon further questioning she actually knows that the fibroid was present as she previously in her reproductive years notes prior imaging and heavy periods.  As mentioned she is currently asymptomatic  07/15/2023: CT shows 7.6 cm partially calcified fibroid.  No adnexal masses noted  Of note it sounds as though patient has not been seen by gynecology and has not had a pelvic exam in many years.  Suspect she has fallen behind on cervical cancer screening.  Will complete testing today  No LMP recorded. Patient is postmenopausal.    Review of Systems:   Pertinent items are noted in HPI Denies fever/chills, dizziness, headaches, visual disturbances, fatigue, shortness of breath, chest pain, abdominal pain, vomiting, no problems with bowel movements, urination, or intercourse unless otherwise stated above.  Pertinent History Reviewed:   Past Surgical History:  Procedure Laterality Date   CATARACT EXTRACTION W/PHACO Right 08/24/2022   Procedure: CATARACT EXTRACTION PHACO AND INTRAOCULAR LENS PLACEMENT ([facility]);  Surgeon: Pecolia Ades, MD;  Location: [facility];  Service: Ophthalmology;  Laterality: Right;  CDE 40.99   CATARACT EXTRACTION W/PHACO Left 09/14/2022   Procedure: CATARACT EXTRACTION PHACO AND INTRAOCULAR LENS PLACEMENT ([facility]);  Surgeon: Pecolia Ades, MD;  Location: [facility];  Service: Ophthalmology;  Laterality: Left;  CDE 9.43   CESAREAN SECTION     TUBAL LIGATION     TYMPANOSTOMY TUBE PLACEMENT     as a child    Past Medical History:  Diagnosis Date    Depression    PTSD (post-traumatic stress disorder)    Reviewed problem list, medications and allergies. Physical Assessment:   Vitals:   08/14/23 1038  BP: 98/61  Pulse: 61  Weight: 110 lb (49.9 kg)  Body mass index is 20.12 kg/m.       Physical Examination:   General appearance: alert, well appearing, and in no distress  Psych: mood appropriate, normal affect  Skin: warm & dry   Cardiovascular: normal heart rate noted  Respiratory: normal respiratory effort, no distress  Abdomen: soft, non-tender   Pelvic: VULVA: normal appearing vulva with no masses, tenderness or lesions, VAGINA: Narrowed introitus with flat mucosa.  No abnormal discharge noted., CERVIX: No abnormalities noted, difficulty with visualization due to patient discomfort.  Small speculum used UTERUS: Enlarged firm uterus consistent with CT findings of fibroid- non-tender  Extremities: no edema   Chaperone: Faith Rogue    Assessment & Plan:  1) Uterine fibroids -Reviewed exam findings CT results, and postmenopausal state no management indicated at this time  2) Preventive screening -Pap collected will continue with testing every 3 months until patient has had 3 negative testing per ASCCP guidelines -Mammogram up-to-date and encouraged every 1 to 2-year screening   Return in about 2 years (around 08/13/2025) for Annual.   Myna Hidalgo, DO Attending Obstetrician & Gynecologist, Faculty Practice Center for [facility], [facility]

## 2023-08-14 NOTE — Telephone Encounter (Signed)
Called patient back and advised that fibroid is in her uterus. Pt had no other questions at this time. Will call back if other concerns.

## 2023-08-14 NOTE — Telephone Encounter (Signed)
Patient called and stated that she was seen this morning and she wanted to ask some questions about where her fibroids are located and would like a call back from a nurse.

## 2023-08-15 LAB — CYTOLOGY - PAP
Comment: NEGATIVE
Diagnosis: NEGATIVE
High risk HPV: NEGATIVE

## 2023-08-28 ENCOUNTER — Other Ambulatory Visit (HOSPITAL_COMMUNITY): Payer: Self-pay | Admitting: Internal Medicine

## 2023-08-28 DIAGNOSIS — Z1231 Encounter for screening mammogram for malignant neoplasm of breast: Secondary | ICD-10-CM

## 2023-10-14 ENCOUNTER — Ambulatory Visit (HOSPITAL_COMMUNITY)
Admission: RE | Admit: 2023-10-14 | Discharge: 2023-10-14 | Disposition: A | Discharge status: Home / Self Care | Payer: Medicare Other | Source: Ambulatory Visit | Attending: Internal Medicine | Admitting: Internal Medicine

## 2023-10-14 DIAGNOSIS — Z1231 Encounter for screening mammogram for malignant neoplasm of breast: Secondary | ICD-10-CM | POA: Diagnosis present

## 2023-10-17 ENCOUNTER — Other Ambulatory Visit (HOSPITAL_COMMUNITY): Payer: Self-pay | Admitting: Internal Medicine

## 2023-10-17 DIAGNOSIS — R928 Other abnormal and inconclusive findings on diagnostic imaging of breast: Secondary | ICD-10-CM

## 2023-11-12 ENCOUNTER — Encounter (HOSPITAL_COMMUNITY): Payer: Self-pay

## 2023-11-12 ENCOUNTER — Ambulatory Visit (HOSPITAL_COMMUNITY)
Admission: RE | Admit: 2023-11-12 | Discharge: 2023-11-12 | Disposition: A | Discharge status: Home / Self Care | Payer: Medicare Other | Source: Ambulatory Visit | Attending: Internal Medicine | Admitting: Internal Medicine

## 2023-11-12 DIAGNOSIS — R928 Other abnormal and inconclusive findings on diagnostic imaging of breast: Secondary | ICD-10-CM
